# Patient Record
Sex: Female | Born: 1970 | Race: White | Hispanic: No | Marital: Married | Smoking: Former smoker
Health system: Southern US, Community
[De-identification: ages and names within clinical notes are randomized; demographics above are authoritative.]

## PROBLEM LIST (undated history)

## (undated) DIAGNOSIS — G4730 Sleep apnea, unspecified: Secondary | ICD-10-CM

## (undated) DIAGNOSIS — F319 Bipolar disorder, unspecified: Secondary | ICD-10-CM

## (undated) DIAGNOSIS — E7800 Pure hypercholesterolemia, unspecified: Secondary | ICD-10-CM

## (undated) DIAGNOSIS — E282 Polycystic ovarian syndrome: Secondary | ICD-10-CM

## (undated) DIAGNOSIS — F32A Depression, unspecified: Secondary | ICD-10-CM

## (undated) HISTORY — PX: WISDOM TOOTH EXTRACTION: SHX21

---

## 2011-12-30 DIAGNOSIS — E282 Polycystic ovarian syndrome: Secondary | ICD-10-CM | POA: Insufficient documentation

## 2011-12-30 DIAGNOSIS — E7800 Pure hypercholesterolemia, unspecified: Secondary | ICD-10-CM | POA: Insufficient documentation

## 2011-12-30 DIAGNOSIS — F329 Major depressive disorder, single episode, unspecified: Secondary | ICD-10-CM | POA: Insufficient documentation

## 2012-09-29 ENCOUNTER — Ambulatory Visit: Payer: Self-pay | Admitting: Internal Medicine

## 2013-02-12 ENCOUNTER — Ambulatory Visit: Payer: Self-pay | Admitting: Family Medicine

## 2014-01-27 DIAGNOSIS — Z9989 Dependence on other enabling machines and devices: Secondary | ICD-10-CM

## 2014-01-27 DIAGNOSIS — G4733 Obstructive sleep apnea (adult) (pediatric): Secondary | ICD-10-CM | POA: Insufficient documentation

## 2014-08-21 ENCOUNTER — Ambulatory Visit: Payer: Self-pay | Admitting: Family Medicine

## 2015-06-02 ENCOUNTER — Ambulatory Visit
Admission: RE | Admit: 2015-06-02 | Discharge: 2015-06-02 | Disposition: A | Discharge status: Home / Self Care | Payer: BLUE CROSS/BLUE SHIELD | Source: Ambulatory Visit | Attending: Family Medicine | Admitting: Family Medicine

## 2015-06-02 ENCOUNTER — Other Ambulatory Visit: Payer: Self-pay | Admitting: Family Medicine

## 2015-06-02 DIAGNOSIS — N946 Dysmenorrhea, unspecified: Secondary | ICD-10-CM

## 2015-06-02 DIAGNOSIS — E282 Polycystic ovarian syndrome: Secondary | ICD-10-CM | POA: Diagnosis not present

## 2015-06-04 ENCOUNTER — Emergency Department
Admission: EM | Admit: 2015-06-04 | Discharge: 2015-06-04 | Discharge status: Absent without leave | Payer: BLUE CROSS/BLUE SHIELD

## 2015-06-04 ENCOUNTER — Other Ambulatory Visit: Payer: Self-pay | Admitting: Family Medicine

## 2015-06-04 DIAGNOSIS — N946 Dysmenorrhea, unspecified: Secondary | ICD-10-CM

## 2015-06-10 ENCOUNTER — Ambulatory Visit: Payer: BLUE CROSS/BLUE SHIELD

## 2015-06-23 DIAGNOSIS — L509 Urticaria, unspecified: Secondary | ICD-10-CM | POA: Insufficient documentation

## 2016-07-08 DIAGNOSIS — D869 Sarcoidosis, unspecified: Secondary | ICD-10-CM

## 2016-07-08 HISTORY — DX: Sarcoidosis, unspecified: D86.9

## 2016-08-22 DIAGNOSIS — R9389 Abnormal findings on diagnostic imaging of other specified body structures: Secondary | ICD-10-CM | POA: Insufficient documentation

## 2016-09-08 DIAGNOSIS — D869 Sarcoidosis, unspecified: Secondary | ICD-10-CM | POA: Insufficient documentation

## 2017-03-08 DIAGNOSIS — R911 Solitary pulmonary nodule: Secondary | ICD-10-CM | POA: Insufficient documentation

## 2017-05-03 DIAGNOSIS — Z79899 Other long term (current) drug therapy: Secondary | ICD-10-CM | POA: Insufficient documentation

## 2017-09-11 ENCOUNTER — Other Ambulatory Visit: Payer: Self-pay | Admitting: Family Medicine

## 2017-09-11 DIAGNOSIS — Z1231 Encounter for screening mammogram for malignant neoplasm of breast: Secondary | ICD-10-CM

## 2017-09-20 ENCOUNTER — Encounter: Payer: Self-pay | Admitting: Radiology

## 2017-09-20 ENCOUNTER — Ambulatory Visit
Admission: RE | Admit: 2017-09-20 | Discharge: 2017-09-20 | Disposition: A | Discharge status: Home / Self Care | Payer: BLUE CROSS/BLUE SHIELD | Source: Ambulatory Visit | Attending: Family Medicine | Admitting: Family Medicine

## 2017-09-20 DIAGNOSIS — Z1231 Encounter for screening mammogram for malignant neoplasm of breast: Secondary | ICD-10-CM | POA: Diagnosis not present

## 2017-10-05 ENCOUNTER — Ambulatory Visit: Payer: BLUE CROSS/BLUE SHIELD

## 2018-04-21 ENCOUNTER — Ambulatory Visit
Admission: EM | Admit: 2018-04-21 | Discharge: 2018-04-21 | Disposition: A | Discharge status: Home / Self Care | Payer: BLUE CROSS/BLUE SHIELD | Attending: Family Medicine | Admitting: Family Medicine

## 2018-04-21 DIAGNOSIS — F3181 Bipolar II disorder: Secondary | ICD-10-CM | POA: Insufficient documentation

## 2018-04-21 DIAGNOSIS — J0100 Acute maxillary sinusitis, unspecified: Secondary | ICD-10-CM | POA: Diagnosis not present

## 2018-04-21 DIAGNOSIS — J309 Allergic rhinitis, unspecified: Secondary | ICD-10-CM | POA: Insufficient documentation

## 2018-04-21 MED ORDER — DOXYCYCLINE HYCLATE 100 MG PO CAPS: 100.0000 mg | ORAL_CAPSULE | Freq: Two times a day (BID) | ORAL | 0 refills | Status: DC

## 2018-04-21 NOTE — ED Provider Notes (Signed)
[facility]    CSN: [id number] Arrival date & time: 04/21/18  1321  History   Chief Complaint Chief Complaint  Patient presents with  . Facial Pain   HPI  47 year old female presents with concerns for sinusitis.  Patient states that she has not felt well for the past month.  She has had right maxillary sinus tenderness, congestion, and fullness.  She reports left ear pain as well.  Associated upper dental pain as well.  She has tried Xyzal, Careers adviser, and Sudafed without relief.  She is also been using Flonase and a Nettie pot without significant improvement.  She thinks that allergies are exacerbating this.  No reports of fever.  No reports of purulent nasal discharge.  No other associated symptoms.  No other complaints or concerns at this time.  Of note, she states that her  Symptoms have been worsening over the past 2 weeks, especially the last week.  PMH: Patient Active Problem List   Diagnosis Date Noted  . Bipolar 2 disorder (HCC) 04/21/2018  . Allergic rhinitis 04/21/2018  . Encounter for long-term (current) use of high-risk medication 05/03/2017  . Pulmonary nodule 03/08/2017  . Lofgren's syndrome 09/08/2016  . Abnormal CXR 08/22/2016  . Pressure urticaria 06/23/2015  . OSA on CPAP 01/27/2014  . Pure hypercholesterolemia 12/30/2011  . PCOS (polycystic ovarian syndrome) 12/30/2011  . Depression 12/30/2011   History reviewed. No pertinent surgical history.  OB History   None    Home Medications    Prior to Admission medications   Medication Sig Start Date End Date Taking? Authorizing Provider  aspirin EC 81 MG tablet Take by mouth.   Yes [provider]  buPROPion (WELLBUTRIN SR) 150 MG 12 hr tablet Take by mouth. 06/11/16  Yes [provider]  Cholecalciferol (VITAMIN D3) 2000 units capsule Take by mouth.   Yes [provider]  fluticasone (FLONASE) 50 MCG/ACT nasal spray SPRAY 2 SPRAYS INTO EACH NOSTRIL EVERY DAY 02/20/18  Yes  [provider]  gabapentin (NEURONTIN) 100 MG capsule TAKE 2 ORAL CAPSULES THREE TIMES DAILY AS NEEDED FOR SEVERE ANXIETY 04/11/18  Yes [provider]  lamoTRIgine (LAMICTAL) 150 MG tablet Take by mouth.   Yes [provider]  levocetirizine (XYZAL) 5 MG tablet Take by mouth. 09/19/16  Yes [provider]  metFORMIN (GLUCOPHAGE) 500 MG tablet Take 500 mg by mouth 2 (two) times daily with a meal. 03/10/18  Yes [provider]  Multiple Vitamin (MULTI-VITAMINS) TABS Take by mouth.   Yes [provider]  simvastatin (ZOCOR) 40 MG tablet TAKE 1 TABLET BY MOUTH EVERY DAY AT NIGHT 03/23/18  Yes [provider]  doxycycline (VIBRAMYCIN) 100 MG capsule Take 1 capsule (100 mg total) by mouth 2 (two) times daily. 04/21/18   Tommie Sams, DO   Family History Family History  Problem Relation Age of Onset  . Breast cancer Neg Hx    Social History Social History   Tobacco Use  . Smoking status: Never Smoker  . Smokeless tobacco: Never Used  Substance Use Topics  . Alcohol use: Not on file  . Drug use: Never   Allergies   Augmentin [amoxicillin-pot clavulanate]   Review of Systems Review of Systems  Constitutional: Negative for fever.  HENT: Positive for congestion, ear pain, sinus pressure and sinus pain.    Physical Exam Triage Vital Signs ED Triage Vitals  Enc Vitals Group     BP 04/21/18 1332 140/90     Pulse  Rate 04/21/18 1332 82     Resp 04/21/18 1332 18     Temp 04/21/18 1332 98.5 F (36.9 C)     Temp Source 04/21/18 1332 Oral     SpO2 04/21/18 1332 100 %     Weight 04/21/18 1334 265 lb (120.2 kg)     Height --      Head Circumference --      Peak Flow --      Pain Score 04/21/18 1334 3     Pain Loc --      Pain Edu? --      Excl. in GC? --    Updated Vital Signs BP 140/90 (BP Location: Left Arm)   Pulse 82   Temp 98.5 F (36.9 C) (Oral)   Resp 18   Wt 265 lb (120.2 kg)   LMP 04/17/2018 (Exact Date)    SpO2 100%    Physical Exam  Constitutional: She is oriented to person, place, and time. She appears well-developed. No distress.  HENT:  Head: Normocephalic and atraumatic.  Mouth/Throat: Oropharynx is clear and moist.  Normal TMs.  Cardiovascular: Normal rate and regular rhythm.  Pulmonary/Chest: Effort normal and breath sounds normal. She has no wheezes. She has no rales.  Neurological: She is alert and oriented to person, place, and time.  Psychiatric: She has a normal mood and affect. Her behavior is normal.  Nursing note and vitals reviewed.  UC Treatments / Results  Labs (all labs ordered are listed, but only abnormal results are displayed) Labs Reviewed - No data to display  EKG None  Radiology No results found.  Procedures Procedures (including critical care time)  Medications Ordered in UC Medications - No data to display  Initial Impression / Assessment and Plan / UC Course  I have reviewed the triage vital signs and the nursing notes.  Pertinent labs & imaging results that were available during my care of the patient were reviewed by me and considered in my medical decision making (see chart for details).    47 year old female presents with maxillary sinusitis.  Treating with Doxy.  Final Clinical Impressions(s) / UC Diagnoses   Final diagnoses:  Acute maxillary sinusitis, recurrence not specified   Discharge Instructions   None    ED Prescriptions    Medication Sig Dispense Auth. Provider   doxycycline (VIBRAMYCIN) 100 MG capsule Take 1 capsule (100 mg total) by mouth 2 (two) times daily. 14 capsule Tommie Samsook, Bell Cai G, DO     Controlled Substance Prescriptions [place] Controlled Substance Registry consulted? Not Applicable   Tommie SamsCook, Zaquan Duffner G, DO 04/21/18 1416

## 2018-04-21 NOTE — ED Triage Notes (Signed)
Pt here for possible sinus infection for a month. Has been having facial pressure, headache, teeth hurt and right ear feels full. Has been taking xyzal, allegra and sudafed.

## 2018-09-17 ENCOUNTER — Other Ambulatory Visit: Payer: Self-pay | Admitting: Family Medicine

## 2018-12-03 ENCOUNTER — Other Ambulatory Visit: Payer: Self-pay | Admitting: Family Medicine

## 2018-12-03 DIAGNOSIS — Z1231 Encounter for screening mammogram for malignant neoplasm of breast: Secondary | ICD-10-CM

## 2018-12-19 ENCOUNTER — Ambulatory Visit
Admission: RE | Admit: 2018-12-19 | Discharge: 2018-12-19 | Disposition: A | Discharge status: Home / Self Care | Payer: BLUE CROSS/BLUE SHIELD | Source: Ambulatory Visit | Attending: Family Medicine | Admitting: Family Medicine

## 2018-12-19 ENCOUNTER — Encounter (INDEPENDENT_AMBULATORY_CARE_PROVIDER_SITE_OTHER): Payer: Self-pay

## 2018-12-19 DIAGNOSIS — Z1231 Encounter for screening mammogram for malignant neoplasm of breast: Secondary | ICD-10-CM

## 2019-12-02 ENCOUNTER — Other Ambulatory Visit: Payer: Self-pay | Admitting: Family Medicine

## 2019-12-02 DIAGNOSIS — Z1231 Encounter for screening mammogram for malignant neoplasm of breast: Secondary | ICD-10-CM

## 2019-12-25 ENCOUNTER — Ambulatory Visit
Admission: RE | Admit: 2019-12-25 | Discharge: 2019-12-25 | Disposition: A | Discharge status: Home / Self Care | Payer: BC Managed Care – PPO | Source: Ambulatory Visit | Attending: Family Medicine | Admitting: Family Medicine

## 2019-12-25 ENCOUNTER — Other Ambulatory Visit: Payer: Self-pay

## 2019-12-25 DIAGNOSIS — Z1231 Encounter for screening mammogram for malignant neoplasm of breast: Secondary | ICD-10-CM | POA: Insufficient documentation

## 2021-02-01 ENCOUNTER — Other Ambulatory Visit: Payer: Self-pay | Admitting: Family Medicine

## 2021-02-01 DIAGNOSIS — Z1231 Encounter for screening mammogram for malignant neoplasm of breast: Secondary | ICD-10-CM

## 2021-02-10 ENCOUNTER — Other Ambulatory Visit: Payer: Self-pay

## 2021-02-10 ENCOUNTER — Ambulatory Visit
Admission: RE | Admit: 2021-02-10 | Discharge: 2021-02-10 | Disposition: A | Discharge status: Home / Self Care | Payer: BC Managed Care – PPO | Source: Ambulatory Visit | Attending: Family Medicine | Admitting: Family Medicine

## 2021-02-10 DIAGNOSIS — Z1231 Encounter for screening mammogram for malignant neoplasm of breast: Secondary | ICD-10-CM | POA: Insufficient documentation

## 2021-11-09 ENCOUNTER — Telehealth: Payer: Self-pay

## 2021-11-09 NOTE — Telephone Encounter (Signed)
CALLED PATIENT NO ANSWER LEFT VOICEMAIL FOR A CALL BACK ? ?

## 2021-11-10 ENCOUNTER — Other Ambulatory Visit: Payer: Self-pay

## 2021-11-10 ENCOUNTER — Telehealth: Payer: Self-pay

## 2021-11-10 DIAGNOSIS — Z1211 Encounter for screening for malignant neoplasm of colon: Secondary | ICD-10-CM

## 2021-11-10 MED ORDER — NA SULFATE-K SULFATE-MG SULF 17.5-3.13-1.6 GM/177ML PO SOLN: 1.0000 | Freq: Once | ORAL | 0 refills | Status: AC

## 2021-11-10 NOTE — Progress Notes (Signed)
Gastroenterology Pre-Procedure Review  Request Date: 12/03/2021 Requesting Physician: Dr. Allen Norris  PATIENT REVIEW QUESTIONS: The patient responded to the following health history questions as indicated:    1. Are you having any GI issues? no 2. Do you have a personal history of Polyps? no 3. Do you have a family history of Colon Cancer or Polyps? yes (polyps) 4. Diabetes Mellitus? no 5. Joint replacements in the past 12 months?no 6. Major health problems in the past 3 months?no 7. Any artificial heart valves, MVP, or defibrillator?no    MEDICATIONS & ALLERGIES:    Patient reports the following regarding taking any anticoagulation/antiplatelet therapy:   Plavix, Coumadin, Eliquis, Xarelto, Lovenox, Pradaxa, Brilinta, or Effient? no Aspirin? no  Patient confirms/reports the following medications:  Current Outpatient Medications  Medication Sig Dispense Refill   aspirin EC 81 MG tablet Take by mouth.     buPROPion (WELLBUTRIN SR) 150 MG 12 hr tablet Take by mouth.     Cholecalciferol (VITAMIN D3) 2000 units capsule Take by mouth.     doxycycline (VIBRAMYCIN) 100 MG capsule Take 1 capsule (100 mg total) by mouth 2 (two) times daily. 14 capsule 0   fluticasone (FLONASE) 50 MCG/ACT nasal spray SPRAY 2 SPRAYS INTO EACH NOSTRIL EVERY DAY  3   gabapentin (NEURONTIN) 100 MG capsule TAKE 2 ORAL CAPSULES THREE TIMES DAILY AS NEEDED FOR SEVERE ANXIETY  1   lamoTRIgine (LAMICTAL) 150 MG tablet Take by mouth.     levocetirizine (XYZAL) 5 MG tablet Take by mouth.     metFORMIN (GLUCOPHAGE) 500 MG tablet Take 500 mg by mouth 2 (two) times daily with a meal.  4   Multiple Vitamin (MULTI-VITAMINS) TABS Take by mouth.     simvastatin (ZOCOR) 40 MG tablet TAKE 1 TABLET BY MOUTH EVERY DAY AT NIGHT  4   No current facility-administered medications for this visit.    Patient confirms/reports the following allergies:  Allergies  Allergen Reactions   Augmentin [Amoxicillin-Pot Clavulanate] Diarrhea     No orders of the defined types were placed in this encounter.   AUTHORIZATION INFORMATION Primary Insurance: 1D#: Group #:  Secondary Insurance: 1D#: Group #:  SCHEDULE INFORMATION: Date: 12/03/2021 Time: Location:[facility]

## 2021-11-10 NOTE — Telephone Encounter (Signed)
CALLED PATIENT NO ANSWER LEFT VOICEMAIL FOR A CALL BACK ? ?

## 2021-11-11 ENCOUNTER — Encounter: Payer: Self-pay | Admitting: Gastroenterology

## 2021-12-02 MED ORDER — PROPOFOL 500 MG/50ML IV EMUL
INTRAVENOUS | Status: AC
  Filled 2021-12-02: qty 150

## 2021-12-02 MED ORDER — LIDOCAINE HCL (PF) 2 % IJ SOLN
INTRAMUSCULAR | Status: AC
  Filled 2021-12-02: qty 5

## 2021-12-02 MED ORDER — GLYCOPYRROLATE 0.2 MG/ML IJ SOLN
INTRAMUSCULAR | Status: AC
  Filled 2021-12-02: qty 1

## 2021-12-02 MED ORDER — PROPOFOL 500 MG/50ML IV EMUL
INTRAVENOUS | Status: AC
  Filled 2021-12-02: qty 50

## 2021-12-02 MED ORDER — DEXMEDETOMIDINE HCL IN NACL 200 MCG/50ML IV SOLN
INTRAVENOUS | Status: AC
  Filled 2021-12-02: qty 50

## 2021-12-02 MED ORDER — PROPOFOL 10 MG/ML IV BOLUS
INTRAVENOUS | Status: AC
  Filled 2021-12-02: qty 20

## 2021-12-03 ENCOUNTER — Other Ambulatory Visit: Payer: Self-pay

## 2021-12-03 ENCOUNTER — Encounter: Payer: Self-pay | Admitting: Gastroenterology

## 2021-12-03 ENCOUNTER — Ambulatory Visit: Payer: BC Managed Care – PPO | Admitting: Anesthesiology

## 2021-12-03 ENCOUNTER — Ambulatory Visit
Admission: RE | Admit: 2021-12-03 | Discharge: 2021-12-03 | Disposition: A | Discharge status: Home / Self Care | Payer: BC Managed Care – PPO | Attending: Gastroenterology | Admitting: Gastroenterology

## 2021-12-03 ENCOUNTER — Encounter
Admission: RE | Disposition: A | Discharge status: Home / Self Care | Payer: Self-pay | Source: Home / Self Care | Attending: Gastroenterology

## 2021-12-03 DIAGNOSIS — Z6841 Body Mass Index (BMI) 40.0 and over, adult: Secondary | ICD-10-CM | POA: Diagnosis not present

## 2021-12-03 DIAGNOSIS — Z87891 Personal history of nicotine dependence: Secondary | ICD-10-CM | POA: Diagnosis not present

## 2021-12-03 DIAGNOSIS — E7800 Pure hypercholesterolemia, unspecified: Secondary | ICD-10-CM | POA: Diagnosis not present

## 2021-12-03 DIAGNOSIS — Z79899 Other long term (current) drug therapy: Secondary | ICD-10-CM | POA: Diagnosis not present

## 2021-12-03 DIAGNOSIS — K5730 Diverticulosis of large intestine without perforation or abscess without bleeding: Secondary | ICD-10-CM | POA: Insufficient documentation

## 2021-12-03 DIAGNOSIS — Z1211 Encounter for screening for malignant neoplasm of colon: Secondary | ICD-10-CM

## 2021-12-03 DIAGNOSIS — K640 First degree hemorrhoids: Secondary | ICD-10-CM | POA: Diagnosis not present

## 2021-12-03 DIAGNOSIS — F319 Bipolar disorder, unspecified: Secondary | ICD-10-CM | POA: Insufficient documentation

## 2021-12-03 DIAGNOSIS — E785 Hyperlipidemia, unspecified: Secondary | ICD-10-CM | POA: Diagnosis not present

## 2021-12-03 DIAGNOSIS — G4730 Sleep apnea, unspecified: Secondary | ICD-10-CM | POA: Diagnosis not present

## 2021-12-03 HISTORY — DX: Sleep apnea, unspecified: G47.30

## 2021-12-03 HISTORY — DX: Bipolar disorder, unspecified: F31.9

## 2021-12-03 HISTORY — PX: COLONOSCOPY WITH PROPOFOL: SHX5780

## 2021-12-03 HISTORY — DX: Polycystic ovarian syndrome: E28.2

## 2021-12-03 HISTORY — DX: Depression, unspecified: F32.A

## 2021-12-03 HISTORY — DX: Pure hypercholesterolemia, unspecified: E78.00

## 2021-12-03 LAB — POCT PREGNANCY, URINE: Preg Test, Ur: NEGATIVE

## 2021-12-03 SURGERY — COLONOSCOPY WITH PROPOFOL
Anesthesia: General

## 2021-12-03 MED ORDER — ACETAMINOPHEN 325 MG PO TABS: 325.0000 mg | ORAL_TABLET | ORAL | Status: DC | PRN

## 2021-12-03 MED ORDER — PROPOFOL 10 MG/ML IV BOLUS
INTRAVENOUS | Status: DC | PRN
  Administered 2021-12-03 (×3): 40 mg via INTRAVENOUS
  Administered 2021-12-03: 150 mg via INTRAVENOUS
  Administered 2021-12-03: 40 mg via INTRAVENOUS

## 2021-12-03 MED ORDER — ACETAMINOPHEN 160 MG/5ML PO SOLN: 325.0000 mg | ORAL | Status: DC | PRN

## 2021-12-03 MED ORDER — ONDANSETRON HCL 4 MG/2ML IJ SOLN: 4.0000 mg | Freq: Once | INTRAMUSCULAR | Status: DC | PRN

## 2021-12-03 MED ORDER — LACTATED RINGERS IV SOLN: INTRAVENOUS | Status: DC

## 2021-12-03 MED ORDER — LIDOCAINE HCL (CARDIAC) PF 100 MG/5ML IV SOSY
PREFILLED_SYRINGE | INTRAVENOUS | Status: DC | PRN
  Administered 2021-12-03: 30 mg via INTRAVENOUS

## 2021-12-03 MED ORDER — SODIUM CHLORIDE 0.9 % IV SOLN: INTRAVENOUS | Status: DC

## 2021-12-03 SURGICAL SUPPLY — 22 items

## 2021-12-03 NOTE — Op Note (Signed)
[facility] Gastroenterology Patient Name: Christine Smith Procedure Date: 12/03/2021 10:09 AM MRN: [id number] Account #: [id number] Date of Birth: 13-Sep-1971 Admit Type: Outpatient Age: 51 Room: [facility] OR ROOM 01 Gender: Female Note Status: Finalized Instrument Name: [id number] Procedure:             Colonoscopy Indications:           Screening for colorectal malignant neoplasm Providers:             Midge Minium MD, MD Referring MD:          Letitia Caul MD, MD (Referring MD) Medicines:             Propofol per Anesthesia Complications:         No immediate complications. Procedure:             Pre-Anesthesia Assessment:                        - Prior to the procedure, a History and Physical was                         performed, and patient medications and allergies were                         reviewed. The patient's tolerance of previous                         anesthesia was also reviewed. The risks and benefits                         of the procedure and the sedation options and risks                         were discussed with the patient. All questions were                         answered, and informed consent was obtained. Prior                         Anticoagulants: The patient has taken no previous                         anticoagulant or antiplatelet agents. ASA Grade                         Assessment: II - A patient with mild systemic disease.                         After reviewing the risks and benefits, the patient                         was deemed in satisfactory condition to undergo the                         procedure.                        After obtaining informed consent, the colonoscope was  passed under direct vision. Throughout the procedure,                         the patient's blood pressure, pulse, and oxygen                         saturations were monitored continuously. The                          Colonoscope was introduced through the anus and                         advanced to the the cecum, identified by appendiceal                         orifice and ileocecal valve. The colonoscopy was                         performed without difficulty. The patient tolerated                         the procedure well. The quality of the bowel                         preparation was excellent. Findings:      The perianal and digital rectal examinations were normal.      Multiple small-mouthed diverticula were found in the sigmoid colon.      Non-bleeding internal hemorrhoids were found during retroflexion. The       hemorrhoids were Grade I (internal hemorrhoids that do not prolapse). Impression:            - Diverticulosis in the sigmoid colon.                        - Non-bleeding internal hemorrhoids.                        - No specimens collected. Recommendation:        - Discharge patient to home.                        - Resume previous diet.                        - Continue present medications.                        - Repeat colonoscopy in 10 years for screening                         purposes. Procedure Code(s):     --- Professional ---                        [phone], Colonoscopy, flexible; diagnostic, including                         collection of specimen(s) by brushing or washing, when                         performed (separate procedure) Diagnosis  Code(s):     --- Professional ---                        Z12.11, Encounter for screening for malignant neoplasm                         of colon CPT copyright 2019 [employer]. All rights reserved. The codes documented in this report are preliminary and upon coder review may  be revised to meet current compliance requirements. Midge Minium MD, MD 12/03/2021 10:29:54 AM This report has been signed electronically. Number of Addenda: 0 Note Initiated On: 12/03/2021 10:09 AM Scope Withdrawal Time: 0 hours 7 minutes  17 seconds  Total Procedure Duration: 0 hours 10 minutes 41 seconds  Estimated Blood Loss:  Estimated blood loss: none.      [facility]

## 2021-12-03 NOTE — Transfer of Care (Signed)
Immediate Anesthesia Transfer of Care Note  Patient: [name]  Procedure(s) Performed: COLONOSCOPY WITH PROPOFOL  Patient Location: PACU  Anesthesia Type: General  Level of Consciousness: awake, alert  and patient cooperative  Airway and Oxygen Therapy: Patient Spontanous Breathing and Patient connected to supplemental oxygen  Post-op Assessment: Post-op Vital signs reviewed, Patient's Cardiovascular Status Stable, Respiratory Function Stable, Patent Airway and No signs of Nausea or vomiting  Post-op Vital Signs: Reviewed and stable  Complications: No notable events documented.

## 2021-12-03 NOTE — H&P (Signed)
Christine Minium, MD [facility] [address], [address] [place], [place] [address] Phone: [phone] Fax : [phone]  Primary Care Physician:  Christine Gala, MD Primary Gastroenterologist:  Dr. Servando Smith  Pre-Procedure History & Physical: HPI:  Christine Smith is a 51 y.o. female is here for a screening colonoscopy.   Past Medical History:  Diagnosis Date   Bipolar disorder (HCC)    type 2   Depression    Hypercholesteremia    Lofgren's syndrome 07/2016   no reoccuring sx in the last few yrs as of 11/11/21   PCOS (polycystic ovarian syndrome)    Sleep apnea    uses CPAP    Past Surgical History:  Procedure Laterality Date   WISDOM TOOTH EXTRACTION      Prior to Admission medications   Medication Sig Start Date End Date Taking? Authorizing Provider  Ascorbic Acid (VITAMIN C ER PO) Take by mouth.   Yes [provider]  Cholecalciferol (VITAMIN D3) 2000 units capsule Take by mouth.   Yes [provider]  Coenzyme Q10 (CO Q 10) 10 MG CAPS Take by mouth.   Yes [provider]  fluticasone (FLONASE) 50 MCG/ACT nasal spray SPRAY 2 SPRAYS INTO EACH NOSTRIL EVERY DAY 02/20/18  Yes [provider]  gabapentin (NEURONTIN) 100 MG capsule TAKE 2 ORAL CAPSULES THREE TIMES DAILY AS NEEDED FOR SEVERE ANXIETY 04/11/18  Yes [provider]  lamoTRIgine (LAMICTAL) 150 MG tablet Take 100 mg by mouth daily.   Yes [provider]  levocetirizine (XYZAL) 5 MG tablet Take by mouth. 09/19/16  Yes [provider]  metFORMIN (GLUCOPHAGE) 500 MG tablet Take 500 mg by mouth 2 (two) times daily with a meal. 03/10/18  Yes [provider]  Multiple Vitamin (MULTI-VITAMINS) TABS Take by mouth.   Yes [provider]  Multiple Vitamins-Minerals (ZINC PO) Take by mouth.   Yes [provider]  simvastatin (ZOCOR) 40 MG tablet TAKE 1 TABLET BY MOUTH EVERY DAY AT NIGHT 03/23/18  Yes [provider]  traZODone (DESYREL) 50 MG  tablet Take 50 mg by mouth at bedtime.   Yes [provider]  vortioxetine HBr (TRINTELLIX) 10 MG TABS tablet Take 10 mg by mouth daily.   Yes [provider]  aspirin EC 81 MG tablet Take by mouth. Patient not taking: Reported on 11/11/2021    [provider]  buPROPion (WELLBUTRIN SR) 150 MG 12 hr tablet Take by mouth. Patient not taking: Reported on 11/11/2021 06/11/16   [provider]  doxycycline (VIBRAMYCIN) 100 MG capsule Take 1 capsule (100 mg total) by mouth 2 (two) times daily. 04/21/18   Tommie Sams, DO    Allergies as of 11/10/2021 - Review Complete 11/10/2021  Allergen Reaction Noted   Augmentin [amoxicillin-pot clavulanate] Diarrhea 04/21/2018    Family History  Problem Relation Age of Onset   Breast cancer Neg Hx     Social History   Socioeconomic History   Marital status: Married    Spouse name: Not on file   Number of children: Not on file   Years of education: Not on file   Highest education level: Not on file  Occupational History   Not on file  Tobacco Use   Smoking status: Former    Years: 12.00    Types: Cigarettes    Quit date: 2017    Years since quitting: 6.0   Smokeless tobacco: Never  Vaping Use   Vaping Use: Never used  Substance and  Sexual Activity   Alcohol use: Yes    Alcohol/week: 23.0 standard drinks    Types: 1 Glasses of wine, 21 Cans of beer, 1 Shots of liquor per week   Drug use: Never   Sexual activity: Not on file  Other Topics Concern   Not on file  Social History Narrative   Not on file   Social Determinants of Health   Financial Resource Strain: Not on file  Food Insecurity: Not on file  Transportation Needs: Not on file  Physical Activity: Not on file  Stress: Not on file  Social Connections: Not on file  Intimate Partner Violence: Not on file    Review of Systems: See HPI, otherwise negative ROS  Physical Exam: BP (!) 154/88    Pulse (!) 101    Temp 98.1 F (36.7 C) (Temporal)     Ht 5\' 4"  (1.626 m)    Wt 118.8 kg    SpO2 98%    BMI 44.97 kg/m  General:   Alert,  pleasant and cooperative in NAD Head:  Normocephalic and atraumatic. Neck:  Supple; no masses or thyromegaly. Lungs:  Clear throughout to auscultation.    Heart:  Regular rate and rhythm. Abdomen:  Soft, nontender and nondistended. Normal bowel sounds, without guarding, and without rebound.   Neurologic:  Alert and  oriented x4;  grossly normal neurologically.  Impression/Plan: Christine Smith is now here to undergo a screening colonoscopy.  Risks, benefits, and alternatives regarding colonoscopy have been reviewed with the patient.  Questions have been answered.  All parties agreeable.

## 2021-12-03 NOTE — Anesthesia Postprocedure Evaluation (Signed)
Anesthesia Post Note  Patient: Christine Smith  Procedure(s) Performed: COLONOSCOPY WITH PROPOFOL     Patient location during evaluation: PACU Anesthesia Type: General Level of consciousness: awake Pain management: pain level controlled Vital Signs Assessment: post-procedure vital signs reviewed and stable Respiratory status: respiratory function stable Cardiovascular status: stable Postop Assessment: no signs of nausea or vomiting Anesthetic complications: no   No notable events documented.  Jola Babinski

## 2021-12-03 NOTE — Anesthesia Preprocedure Evaluation (Signed)
Anesthesia Evaluation  Patient identified by MRN, date of birth, ID band Patient awake    Reviewed: Allergy & Precautions, NPO status   Airway Mallampati: II  TM Distance: >3 FB     Dental   Pulmonary sleep apnea , former smoker,    Pulmonary exam normal        Cardiovascular  Rhythm:Regular Rate:Normal  HLD   Neuro/Psych PSYCHIATRIC DISORDERS Depression Bipolar Disorder    GI/Hepatic   Endo/Other  Morbid obesity (BMI 45)  Renal/GU      Musculoskeletal   Abdominal   Peds  Hematology   Anesthesia Other Findings   Reproductive/Obstetrics                             Anesthesia Physical Anesthesia Plan  ASA: 3  Anesthesia Plan: General   Post-op Pain Management:    Induction: Intravenous  PONV Risk Score and Plan: Propofol infusion, TIVA and Treatment may vary due to age or medical condition  Airway Management Planned: Natural Airway and Nasal Cannula  Additional Equipment:   Intra-op Plan:   Post-operative Plan:   Informed Consent: I have reviewed the patients History and Physical, chart, labs and discussed the procedure including the risks, benefits and alternatives for the proposed anesthesia with the patient or authorized representative who has indicated his/her understanding and acceptance.       Plan Discussed with: CRNA  Anesthesia Plan Comments:         Anesthesia Quick Evaluation

## 2021-12-03 NOTE — Anesthesia Procedure Notes (Signed)
Date/Time: 12/03/2021 10:14 AM Performed by: Maree Krabbe, CRNA Pre-anesthesia Checklist: Patient identified, Emergency Drugs available, Suction available, Timeout performed and Patient being monitored Patient Re-evaluated:Patient Re-evaluated prior to induction Oxygen Delivery Method: Nasal cannula Placement Confirmation: positive ETCO2

## 2021-12-06 ENCOUNTER — Encounter: Payer: Self-pay | Admitting: Gastroenterology

## 2022-01-23 ENCOUNTER — Other Ambulatory Visit: Payer: Self-pay

## 2022-01-23 ENCOUNTER — Encounter: Payer: Self-pay | Admitting: Emergency Medicine

## 2022-01-23 ENCOUNTER — Ambulatory Visit
Admission: EM | Admit: 2022-01-23 | Discharge: 2022-01-23 | Disposition: A | Discharge status: Home / Self Care | Payer: BC Managed Care – PPO | Attending: Emergency Medicine | Admitting: Emergency Medicine

## 2022-01-23 DIAGNOSIS — H66002 Acute suppurative otitis media without spontaneous rupture of ear drum, left ear: Secondary | ICD-10-CM | POA: Diagnosis not present

## 2022-01-23 DIAGNOSIS — J069 Acute upper respiratory infection, unspecified: Secondary | ICD-10-CM

## 2022-01-23 DIAGNOSIS — R051 Acute cough: Secondary | ICD-10-CM

## 2022-01-23 MED ORDER — IPRATROPIUM BROMIDE 0.06 % NA SOLN: 2.0000 | Freq: Four times a day (QID) | NASAL | 12 refills | Status: DC

## 2022-01-23 MED ORDER — CEFDINIR 300 MG PO CAPS: 300.0000 mg | ORAL_CAPSULE | Freq: Two times a day (BID) | ORAL | 0 refills | Status: AC

## 2022-01-23 MED ORDER — PROMETHAZINE-DM 6.25-15 MG/5ML PO SYRP: 5.0000 mL | ORAL_SOLUTION | Freq: Four times a day (QID) | ORAL | 0 refills | Status: DC | PRN

## 2022-01-23 NOTE — ED Triage Notes (Signed)
Patient c/o cough and chest congestion since Monday.  Patient states that she had a visit with a doctor on Thursday and was given [employer].  Patient also has been taking OTC Delsym and Ibuprofen.  Patient reports fever off and on.  ?

## 2022-01-23 NOTE — ED Provider Notes (Signed)
?Christine Smith ? ? ? ?CSN: [id number] ?Arrival date & time: 01/23/22  1411 ? ? ?  ? ?History   ?Chief Complaint ?Chief Complaint  ?Patient presents with  ? Cough  ? ? ?HPI ?[name] is a 51 y.o. female.  ? ?HPI ? ?51 year old female here for evaluation of respiratory complaints. ? ?Patient reports that 9 days ago she developed symptoms of a sore throat followed by a cough 2 days later.  The cough is also accompanied by fever with a Tmax of 100.7, runny nose and nasal congestion, ear pain, significant sinus pressure, nonproductive cough, and shortness of breath when she gets into a coughing fit.  She denies any wheezing or GI complaints.  She has had 3 negative COVID tests at home.  She did have a telehealth visit with a doctor 3 days ago that resulted in a prescription for [employer] which have not been helping.  She is also been taking over-the-counter Delsym without any significant improvement of symptoms. ? ?Past Medical History:  ?Diagnosis Date  ? Bipolar disorder ([place])   ? type 2  ? Depression   ? Hypercholesteremia   ? Lofgren's syndrome 07/2016  ? no reoccuring sx in the last few yrs as of 11/11/21  ? PCOS (polycystic ovarian syndrome)   ? Sleep apnea   ? uses CPAP  ? ? ?Patient Active Problem List  ? Diagnosis Date Noted  ? Colon cancer screening   ? Bipolar 2 disorder ([place]) 04/21/2018  ? Allergic rhinitis 04/21/2018  ? Encounter for long-term (current) use of high-risk medication 05/03/2017  ? Pulmonary nodule 03/08/2017  ? Lofgren's syndrome 09/08/2016  ? Abnormal CXR 08/22/2016  ? Pressure urticaria 06/23/2015  ? OSA on CPAP 01/27/2014  ? Pure hypercholesterolemia 12/30/2011  ? PCOS (polycystic ovarian syndrome) 12/30/2011  ? Depression 12/30/2011  ? ? ?Past Surgical History:  ?Procedure Laterality Date  ? COLONOSCOPY WITH PROPOFOL N/A 12/03/2021  ? Procedure: COLONOSCOPY WITH PROPOFOL;  Surgeon: Lucilla Lame, MD;  Location: [facility];  Service: Endoscopy;  Laterality:  N/A;  ? WISDOM TOOTH EXTRACTION    ? ? ?OB History   ?No obstetric history on file. ?  ? ? ? ?Home Medications   ? ?Prior to Admission medications   ?Medication Sig Start Date End Date Taking? Authorizing Provider  ?Ascorbic Acid (VITAMIN C ER PO) Take by mouth.   Yes [provider]  ?cefdinir (OMNICEF) 300 MG capsule Take 1 capsule (300 mg total) by mouth 2 (two) times daily for 10 days. 01/23/22 02/02/22 Yes Margarette Canada, NP  ?Cholecalciferol (VITAMIN D3) 2000 units capsule Take by mouth.   Yes [provider]  ?fluticasone (FLONASE) 50 MCG/ACT nasal spray SPRAY 2 SPRAYS INTO EACH NOSTRIL EVERY DAY 02/20/18  Yes [provider]  ?ipratropium (ATROVENT) 0.06 % nasal spray Place 2 sprays into both nostrils 4 (four) times daily. 01/23/22  Yes Margarette Canada, NP  ?lamoTRIgine (LAMICTAL) 150 MG tablet Take 100 mg by mouth daily.   Yes [provider]  ?levocetirizine (XYZAL) 5 MG tablet Take by mouth. 09/19/16  Yes [provider]  ?metFORMIN (GLUCOPHAGE) 500 MG tablet Take 500 mg by mouth 2 (two) times daily with a meal. 03/10/18  Yes [provider]  ?Multiple Vitamins-Minerals (ZINC PO) Take by mouth.   Yes [provider]  ?promethazine-dextromethorphan (PROMETHAZINE-DM) 6.25-15 MG/5ML syrup Take 5 mLs by mouth 4 (four) times daily as needed. 01/23/22  Yes Margarette Canada, NP  ?simvastatin (ZOCOR) 40  MG tablet TAKE 1 TABLET BY MOUTH EVERY DAY AT NIGHT 03/23/18  Yes [provider]  ?vortioxetine HBr (TRINTELLIX) 10 MG TABS tablet Take 10 mg by mouth daily.   Yes [provider]  ?buPROPion (WELLBUTRIN SR) 150 MG 12 hr tablet Take by mouth. ?Patient not taking: Reported on 11/11/2021 06/11/16   [provider]  ?Coenzyme Q10 (CO Q 10) 10 MG CAPS Take by mouth.    [provider]  ?traZODone (DESYREL) 50 MG tablet Take 50 mg by mouth at bedtime.    [provider]  ? ? ?Family History ?Family History  ?Problem Relation Age of  Onset  ? Breast cancer Neg Hx   ? ? ?Social History ?Social History  ? ?Tobacco Use  ? Smoking status: Former  ?  Years: 12.00  ?  Types: Cigarettes  ?  Quit date: 2017  ?  Years since quitting: 6.2  ? Smokeless tobacco: Never  ?Vaping Use  ? Vaping Use: Never used  ?Substance Use Topics  ? Alcohol use: Yes  ?  Alcohol/week: 23.0 standard drinks  ?  Types: 1 Glasses of wine, 21 Cans of beer, 1 Shots of liquor per week  ? Drug use: Never  ? ? ? ?Allergies   ?Augmentin [amoxicillin-pot clavulanate] ? ? ?Review of Systems ?Review of Systems  ?Constitutional:  Positive for fever. Negative for activity change and appetite change.  ?HENT:  Positive for congestion, ear pain, rhinorrhea and sore throat.   ?Respiratory:  Positive for cough and shortness of breath. Negative for wheezing.   ?Gastrointestinal:  Negative for diarrhea, nausea and vomiting.  ?Skin:  Negative for rash.  ?Hematological: Negative.   ?Psychiatric/Behavioral: Negative.    ? ? ?Physical Exam ?Triage Vital Signs ?ED Triage Vitals  ?Enc Vitals Group  ?   BP 01/23/22 1422 (!) 150/79  ?   Pulse Rate 01/23/22 1422 94  ?   Resp 01/23/22 1422 14  ?   Temp 01/23/22 1422 98.5 ?F (36.9 ?C)  ?   Temp Source 01/23/22 1422 Oral  ?   SpO2 01/23/22 1422 98 %  ?   Weight 01/23/22 1417 270 lb (122.5 kg)  ?   Height 01/23/22 1417 5\' 4"  (1.626 m)  ?   Head Circumference --   ?   Peak Flow --   ?   Pain Score 01/23/22 1417 0  ?   Pain Loc --   ?   Pain Edu? --   ?   Excl. in [place]? --   ? ?No data found. ? ?Updated Vital Signs ?BP (!) 150/79 (BP Location: Left Arm)   Pulse 94   Temp 98.5 ?F (36.9 ?C) (Oral)   Resp 14   Ht 5\' 4"  (1.626 m)   Wt 270 lb (122.5 kg)   LMP 01/09/2022 (Approximate)   SpO2 98%   BMI 46.35 kg/m?  ? ?Visual Acuity ?Right Eye Distance:   ?Left Eye Distance:   ?Bilateral Distance:   ? ?Right Eye Near:   ?Left Eye Near:    ?Bilateral Near:    ? ?Physical Exam ?Vitals and nursing note reviewed.  ?Constitutional:   ?   Appearance: Normal appearance.  She is not ill-appearing.  ?HENT:  ?   Head: Normocephalic and atraumatic.  ?   Right Ear: Tympanic membrane, ear canal and external ear normal. There is no impacted cerumen.  ?   Left Ear: Ear canal and external ear normal. There is no impacted cerumen.  ?  Nose: Congestion and rhinorrhea present.  ?   Mouth/Throat:  ?   Mouth: Mucous membranes are moist.  ?   Pharynx: Oropharynx is clear. No oropharyngeal exudate or posterior oropharyngeal erythema.  ?Cardiovascular:  ?   Rate and Rhythm: Normal rate and regular rhythm.  ?   Pulses: Normal pulses.  ?   Heart sounds: Normal heart sounds. No murmur heard. ?  No friction rub. No gallop.  ?Pulmonary:  ?   Effort: Pulmonary effort is normal.  ?   Breath sounds: Normal breath sounds. No wheezing, rhonchi or rales.  ?Musculoskeletal:  ?   Cervical back: Normal range of motion and neck supple.  ?Lymphadenopathy:  ?   Cervical: No cervical adenopathy.  ?Skin: ?   General: Skin is warm and dry.  ?   Capillary Refill: Capillary refill takes less than 2 seconds.  ?   Findings: No erythema or rash.  ?Neurological:  ?   General: No focal deficit present.  ?   Mental Status: She is alert and oriented to person, place, and time.  ?Psychiatric:     ?   Mood and Affect: Mood normal.     ?   Behavior: Behavior normal.     ?   Thought Content: Thought content normal.     ?   Judgment: Judgment normal.  ? ? ? ?UC Treatments / Results  ?Labs ?(all labs ordered are listed, but only abnormal results are displayed) ?Labs Reviewed - No data to display ? ?EKG ? ? ?Radiology ?No results found. ? ?Procedures ?Procedures (including critical care time) ? ?Medications Ordered in UC ?Medications - No data to display ? ?Initial Impression / Assessment and Plan / UC Course  ?I have reviewed the triage vital signs and the nursing notes. ? ?Pertinent labs & imaging results that were available during my care of the patient were reviewed by me and considered in my medical decision making (see chart  for details). ? ?Patient is a very pleasant, nontoxic-appearing 51 year old female here for evaluation of respiratory complaints as outlined in the HPI above.  She has been using over-the-counter and prescription

## 2022-01-23 NOTE — Discharge Instructions (Signed)
Take the Cefdinir twice daily for 10 days with food for treatment of your ear infection.  Take an over-the-counter probiotic 1 hour after each dose of antibiotic to prevent diarrhea.  Use over-the-counter Tylenol and ibuprofen as needed for pain or fever.  Place a hot water bottle, or heating pad, underneath your pillowcase at night to help dilate up your ear and aid in pain relief as well as resolution of the infection.  Use the Atrovent nasal spray, 2 squirts in each nostril every 6 hours, as needed for runny nose and postnasal drip.  Use the Tessalon Perles every 8 hours during the day.  Take them with a small sip of water.  They may give you some numbness to the base of your tongue or a metallic taste in your mouth, this is normal.  Use the Promethazine DM cough syrup at bedtime for cough and congestion.  It will make you drowsy so do not take it during the day.  Return for reevaluation for any new or worsening symptoms.  

## 2022-01-27 ENCOUNTER — Other Ambulatory Visit: Payer: Self-pay | Admitting: Family Medicine

## 2022-01-27 DIAGNOSIS — Z1231 Encounter for screening mammogram for malignant neoplasm of breast: Secondary | ICD-10-CM

## 2022-02-07 NOTE — Progress Notes (Signed)
[facility] ?[address] ?[place], [place] [address] ? ?Pulmonary Sleep Medicine  ? ?Office Visit Note ? ?Patient Name: [facility] ?DOB: 14-Jan-1971 ?MRN [id number] ? ? ? ?Chief Complaint: Obstructive Sleep Apnea visit ? ?Brief History: ? ?Christine Smith is seen today for an initial consult for CPAP@ 6 cmH2O. The patient has a 15 year history of sleep apnea. Patient is using PAP nightly. She has used her new device for 8 nights and then went back to her old device because the pressure felt better on it with a higher pressure. Recent studies recommended a pressure setting of 6cmh20 but patient had been on 10cmh20 previously. Patient rests better with no reported snoring or gasps on the 10cmh20 from her spouse. The patient feels rested after sleeping with PAP.  The patient reports benefit from PAP use. Reported sleepiness is  improved and the Epworth Sleepiness Score is 3 out of 24. The patient does not take naps. The patient complains of the following: current pressure is not adequate, patient doesn't feel like she is getting enough air.  The compliance download shows 18% compliance with an average use time of 7 hours 56 minutes. The AHI is 0.6.  Due to too low pressure, she used her old machine recently which is why her compliance is reduced. The patient does have some restlessness of limb movements disrupting sleep, but attributes this to medication she started and is switching the timing. ? ?ROS ? ?General: (-) fever, (-) chills, (-) night sweat ?Nose and Sinuses: (-) nasal stuffiness or itchiness, (-) postnasal drip, (-) nosebleeds, (-) sinus trouble. ?Mouth and Throat: (-) sore throat, (-) hoarseness. ?Neck: (-) swollen glands, (-) enlarged thyroid, (-) neck pain. ?Respiratory: - cough, - shortness of breath, - wheezing. ?Neurologic: + numbness, + tingling. ?Psychiatric: - anxiety, - depression ? ? ?Current Medication: ?Outpatient Encounter Medications as of 02/08/2022  ?Medication Sig Note  ?  Ascorbic Acid (VITAMIN C ER PO) Take by mouth.   ? Cholecalciferol (VITAMIN D3) 2000 units capsule Take by mouth.   ? Coenzyme Q10 (CO Q 10) 10 MG CAPS Take by mouth.   ? fluticasone (FLONASE) 50 MCG/ACT nasal spray SPRAY 2 SPRAYS INTO EACH NOSTRIL EVERY DAY   ? ipratropium (ATROVENT) 0.06 % nasal spray Place 2 sprays into both nostrils 4 (four) times daily.   ? lamoTRIgine (LAMICTAL) 150 MG tablet Take 100 mg by mouth daily.   ? levocetirizine (XYZAL) 5 MG tablet Take by mouth.   ? metFORMIN (GLUCOPHAGE) 500 MG tablet Take 500 mg by mouth 2 (two) times daily with a meal. 11/11/2021: Takes for PCOS, not diabetes  ? Multiple Vitamins-Minerals (ZINC PO) Take by mouth.   ? promethazine-dextromethorphan (PROMETHAZINE-DM) 6.25-15 MG/5ML syrup Take 5 mLs by mouth 4 (four) times daily as needed.   ? simvastatin (ZOCOR) 40 MG tablet TAKE 1 TABLET BY MOUTH EVERY DAY AT NIGHT   ? vortioxetine HBr (TRINTELLIX) 10 MG TABS tablet Take 10 mg by mouth daily.   ? [DISCONTINUED] buPROPion (WELLBUTRIN SR) 150 MG 12 hr tablet Take by mouth. (Patient not taking: Reported on 11/11/2021)   ? [DISCONTINUED] traZODone (DESYREL) 50 MG tablet Take 50 mg by mouth at bedtime.   ? ?No facility-administered encounter medications on file as of 02/08/2022.  ? ? ?Surgical History: ?Past Surgical History:  ?Procedure Laterality Date  ? COLONOSCOPY WITH PROPOFOL N/A 12/03/2021  ? Procedure: COLONOSCOPY WITH PROPOFOL;  Surgeon: Midge Minium, MD;  Location: [facility];  Service: Endoscopy;  Laterality:  N/A;  ? WISDOM TOOTH EXTRACTION    ? ? ?Medical History: ?Past Medical History:  ?Diagnosis Date  ? Bipolar disorder ([place])   ? type 2  ? Depression   ? Hypercholesteremia   ? Lofgren's syndrome 07/2016  ? no reoccuring sx in the last few yrs as of 11/11/21  ? PCOS (polycystic ovarian syndrome)   ? Sleep apnea   ? uses CPAP  ? ? ?Family History: ?Non contributory to the present illness ? ?Social History: ?Social History  ? ?Socioeconomic History  ?  Marital status: Married  ?  Spouse name: Not on file  ? Number of children: Not on file  ? Years of education: Not on file  ? Highest education level: Not on file  ?Occupational History  ? Not on file  ?Tobacco Use  ? Smoking status: Former  ?  Years: 12.00  ?  Types: Cigarettes  ?  Quit date: 2017  ?  Years since quitting: 6.2  ? Smokeless tobacco: Never  ?Vaping Use  ? Vaping Use: Never used  ?Substance and Sexual Activity  ? Alcohol use: Yes  ?  Alcohol/week: 23.0 standard drinks  ?  Types: 1 Glasses of wine, 21 Cans of beer, 1 Shots of liquor per week  ? Drug use: Never  ? Sexual activity: Not on file  ?Other Topics Concern  ? Not on file  ?Social History Narrative  ? Not on file  ? ?Social Determinants of Health  ? ?Financial Resource Strain: Not on file  ?Food Insecurity: Not on file  ?Transportation Needs: Not on file  ?Physical Activity: Not on file  ?Stress: Not on file  ?Social Connections: Not on file  ?Intimate Partner Violence: Not on file  ? ? ?Vital Signs: ?Blood pressure (!) 137/99, pulse 90, resp. rate 16, height 5\' 4"  (1.626 m), weight 275 lb 9.6 oz (125 kg), last menstrual period 01/09/2022, SpO2 97 %. ?Body mass index is 47.31 kg/m?.  ? ? ?Examination: ?General Appearance: The patient is well-developed, well-nourished, and in no distress. ?Neck Circumference: 41.5cm ?Skin: Gross inspection of skin unremarkable. ?Head: normocephalic, no gross deformities. ?Eyes: no gross deformities noted. ?ENT: ears appear grossly normal ?Neurologic: Alert and oriented. No involuntary movements. ? ? ? ?EPWORTH SLEEPINESS SCALE: ? ?Scale:  ?(0)= no chance of dozing; (1)= slight chance of dozing; (2)= moderate chance of dozing; (3)= high chance of dozing ? ?Chance  Situtation ?   ?Sitting and reading: 0 ?  ? Watching TV: 0 ?   ?Sitting Inactive in public: 0 ?   ?As a passenger in car: 2   ?   ?Lying down to rest: 1 ?   ?Sitting and talking: 0 ?   ?Sitting quielty after lunch: 0 ?   ?In a car, stopped in traffic:  0 ? ? ?TOTAL SCORE:   3 out of 24 ? ? ? ?SLEEP STUDIES: ? ?PSG (11/2021) AHI 7.5/hr, REM AHI 19.4/hr, min SpO2 85% ?Titration (11/2021) CPAP@ 6 cmH2O, PLM 10.1 ? ? ?CPAP COMPLIANCE DATA: ? ?Date Range: 12/30/2021-02/05/2022  (PATIENT USED HER OLD MACHINE THE REMAINDER OF THE MONTH DUE TO PRESSURE SETTING) ? ?Average Daily Use: 7 hours 58 minutes ? ?Median Use: 7 hours 46 minutes ? ?Compliance for > 4 Hours: 18% ? ?AHI: 0.6 respiratory events per hour ? ?Days Used: 8/38 days ? ?Mask Leak: 3.0 ? ?95th Percentile Pressure: 6 ? ? ? ? ? ? ? ? ?LABS: ?Recent Results (from the past 2160 hour(s))  ?Pregnancy, urine  POC     Status: None  ? Collection Time: 12/03/21  9:15 AM  ?Result Value Ref Range  ? Preg Test, Ur NEGATIVE NEGATIVE  ?  Comment:        ?THE SENSITIVITY OF THIS ?METHODOLOGY IS >24 mIU/mL ?  ? ? ?Radiology: ?No results found. ? ?No results found. ? ?No results found. ? ? ? ?Assessment and Plan: ?Patient Active Problem List  ? Diagnosis Date Noted  ? Colon cancer screening   ? Bipolar 2 disorder ([place]) 04/21/2018  ? Allergic rhinitis 04/21/2018  ? Encounter for long-term (current) use of high-risk medication 05/03/2017  ? Pulmonary nodule 03/08/2017  ? Lofgren's syndrome 09/08/2016  ? Abnormal CXR 08/22/2016  ? Pressure urticaria 06/23/2015  ? OSA on CPAP 01/27/2014  ? Pure hypercholesterolemia 12/30/2011  ? PCOS (polycystic ovarian syndrome) 12/30/2011  ? Depression 12/30/2011  ? ? ? ? ?The patient does tolerate PAP and reports benefit from PAP use. The patient was reminded how to adjust mask fit and advised to change supplies regularly. The patient was also counselled on nightly use. The compliance is poor however she used her old machine as well. The AHI is 0.6. pressure increased to 10cm h2O due to patient preference and will check remote 1 month download. ? ? ?1. OSA on CPAP ?Continue nightly use ? ?2. CPAP use counseling ?CPAP couseling-Discussed importance of adequate CPAP use as well as proper care and  cleaning techniques of machine and all supplies. ? ?3. Bipolar 2 disorder ([place]) ?Continue current medication and f/u with psych. ? ?4. Morbid obesity with BMI of 45.0-49.9, adult ([place]) ?Obesity Counseling: Had a le

## 2022-02-08 ENCOUNTER — Ambulatory Visit (INDEPENDENT_AMBULATORY_CARE_PROVIDER_SITE_OTHER): Payer: BC Managed Care – PPO | Admitting: Internal Medicine

## 2022-02-08 VITALS — BP 137/99 | HR 90 | Resp 16 | Ht 64.0 in | Wt 275.6 lb

## 2022-02-08 DIAGNOSIS — Z7189 Other specified counseling: Secondary | ICD-10-CM

## 2022-02-08 DIAGNOSIS — R030 Elevated blood-pressure reading, without diagnosis of hypertension: Secondary | ICD-10-CM

## 2022-02-08 DIAGNOSIS — F3181 Bipolar II disorder: Secondary | ICD-10-CM

## 2022-02-08 DIAGNOSIS — Z6841 Body Mass Index (BMI) 40.0 and over, adult: Secondary | ICD-10-CM

## 2022-02-08 DIAGNOSIS — G4733 Obstructive sleep apnea (adult) (pediatric): Secondary | ICD-10-CM

## 2022-02-08 DIAGNOSIS — Z9989 Dependence on other enabling machines and devices: Secondary | ICD-10-CM

## 2022-02-08 NOTE — Patient Instructions (Signed)

## 2022-03-10 ENCOUNTER — Ambulatory Visit
Admission: RE | Admit: 2022-03-10 | Discharge: 2022-03-10 | Disposition: A | Discharge status: Home / Self Care | Payer: BC Managed Care – PPO | Source: Ambulatory Visit | Attending: Family Medicine | Admitting: Family Medicine

## 2022-03-10 DIAGNOSIS — Z1231 Encounter for screening mammogram for malignant neoplasm of breast: Secondary | ICD-10-CM | POA: Insufficient documentation

## 2022-12-28 IMAGING — MG MM DIGITAL SCREENING BILAT W/ TOMO AND CAD
8 series · 8 of 24 positions shown · non-contrast
Comparison: Previous exam(s).

CLINICAL DATA: Screening.

EXAM:
DIGITAL SCREENING BILATERAL MAMMOGRAM WITH TOMOSYNTHESIS AND CAD
TECHNIQUE: Bilateral screening digital craniocaudal and mediolateral oblique
mammograms were obtained. Bilateral screening digital breast
tomosynthesis was performed. The images were evaluated with
computer-aided detection.

[L CC synth-2D]
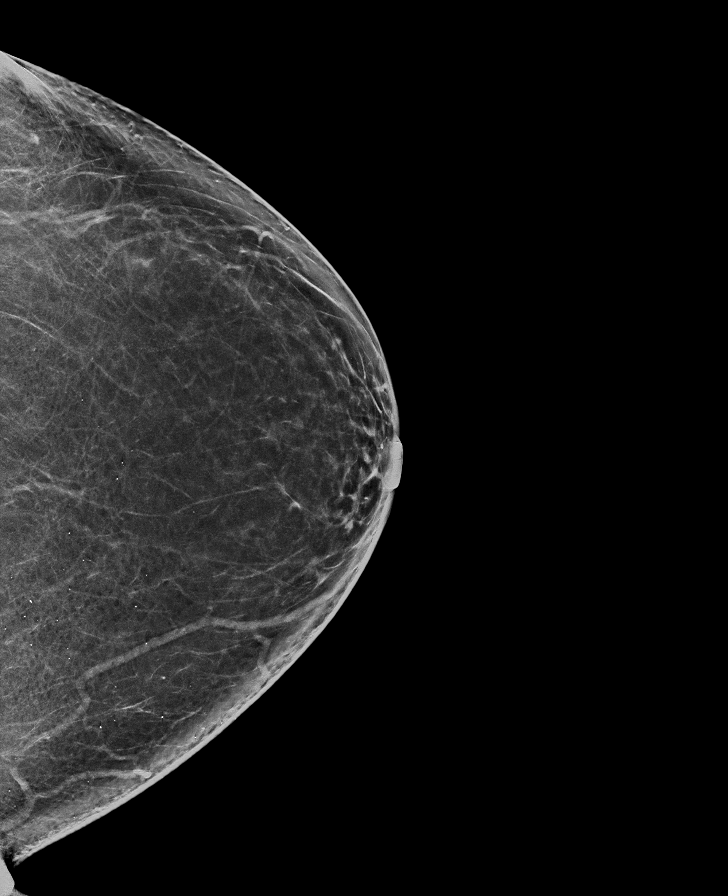

[L MLO synth-2D]
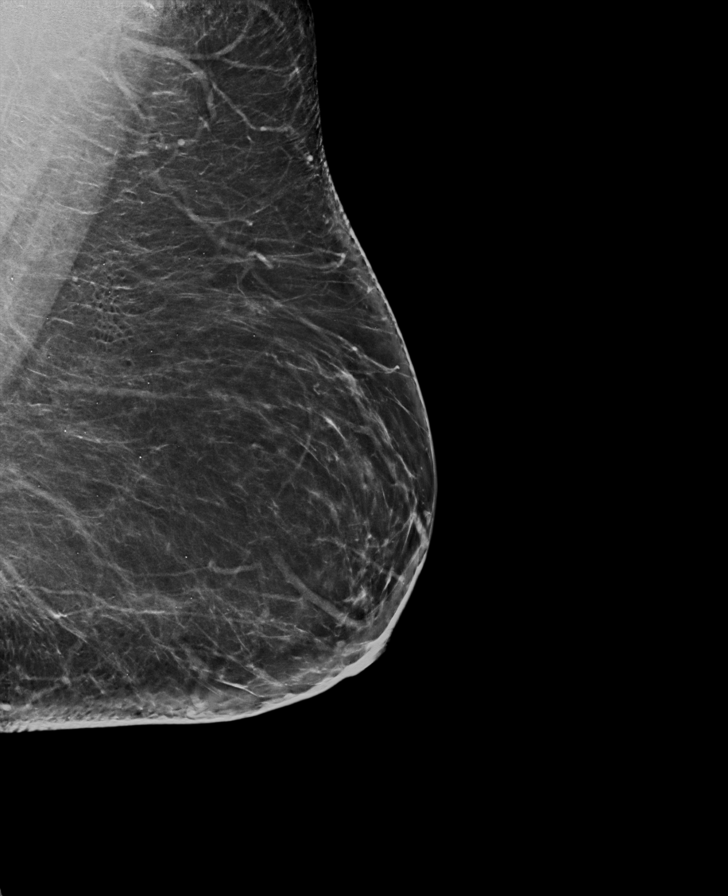

[R CC synth-2D]
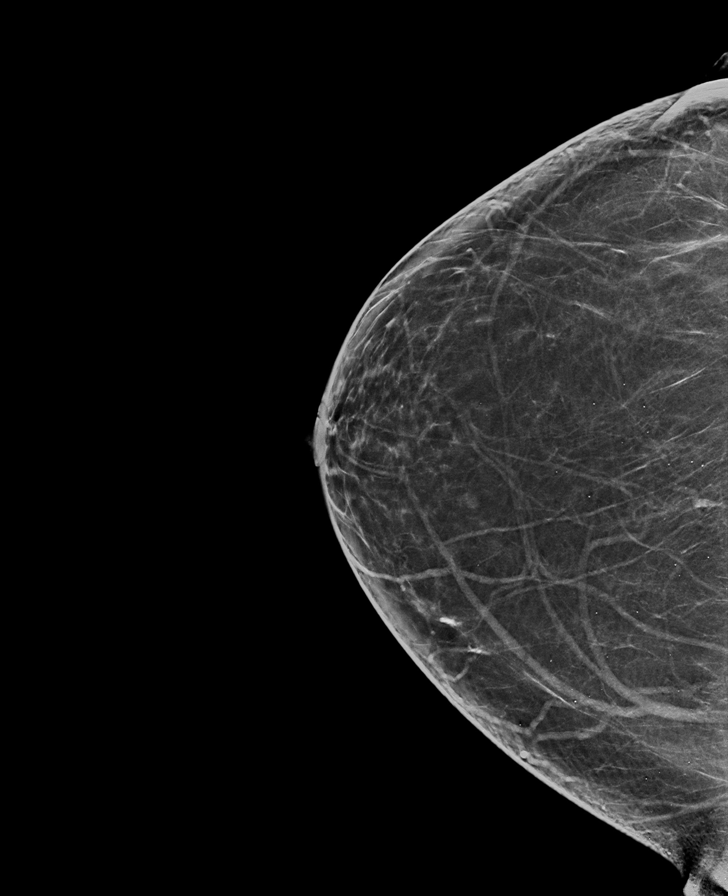

[R MLO synth-2D]
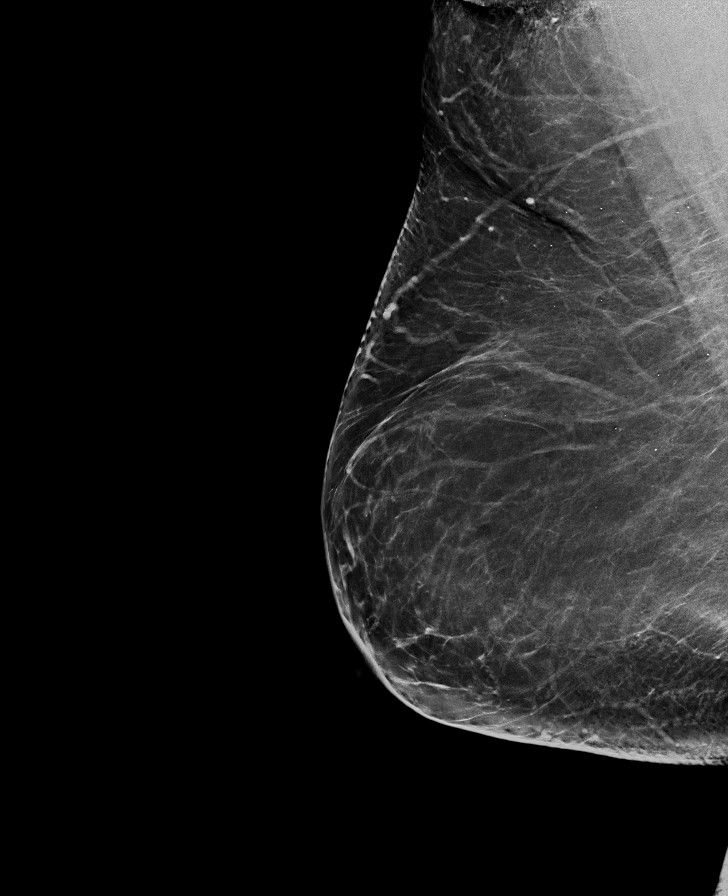

[R CC tomo · tomo slice 41/80.0]
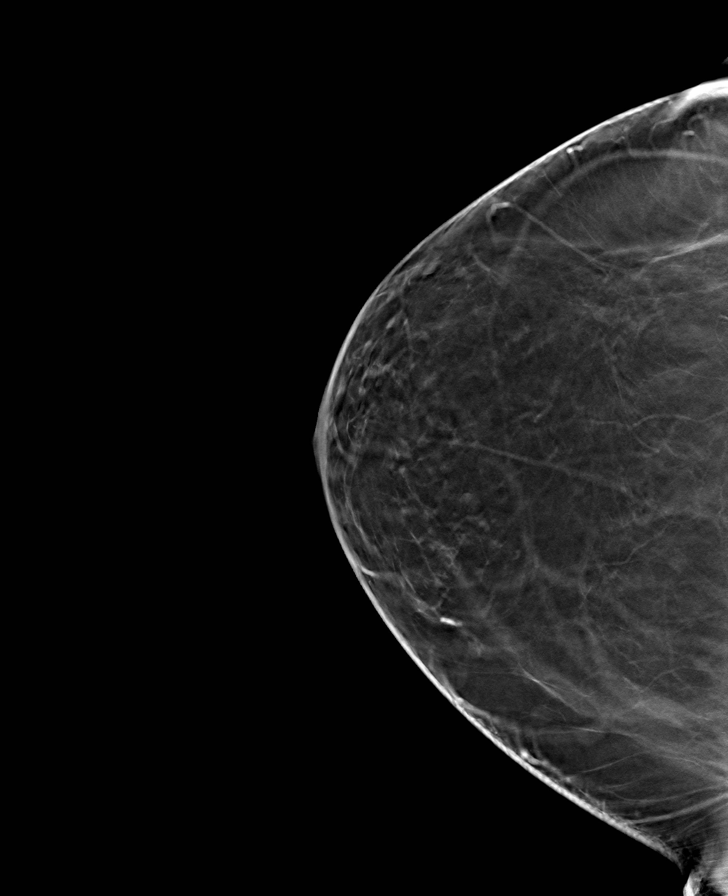

[L CC tomo · tomo slice 41/82.0]
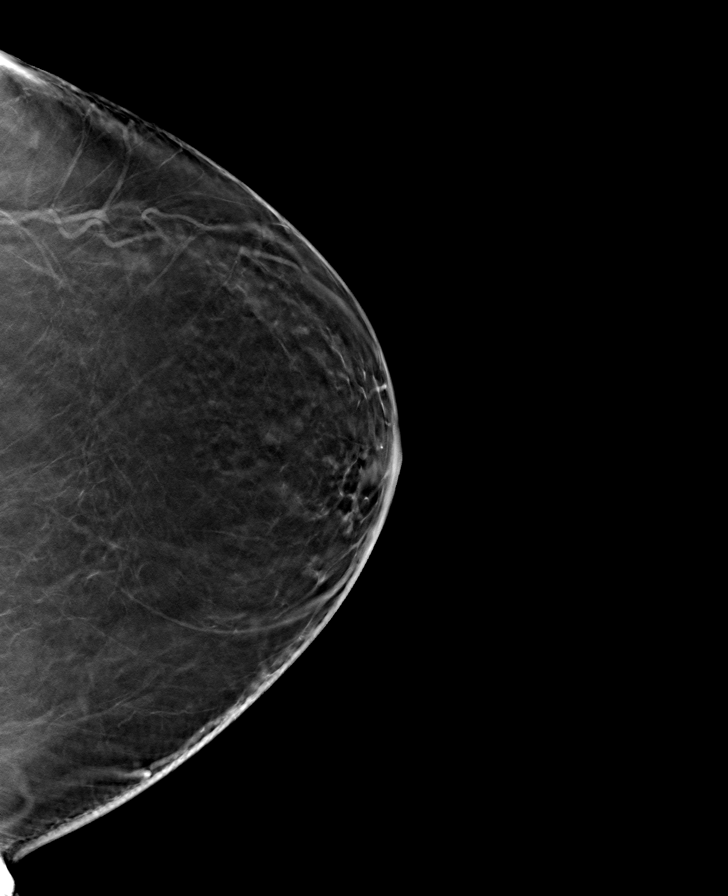

[R MLO tomo · tomo slice 45/90.0]
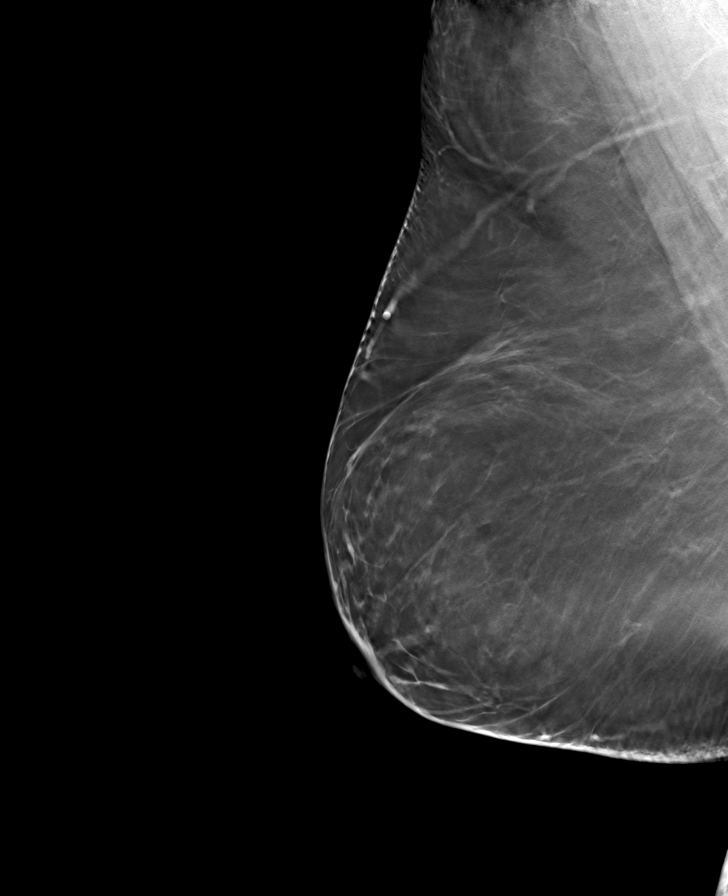

[L MLO tomo · tomo slice 45/89.0]
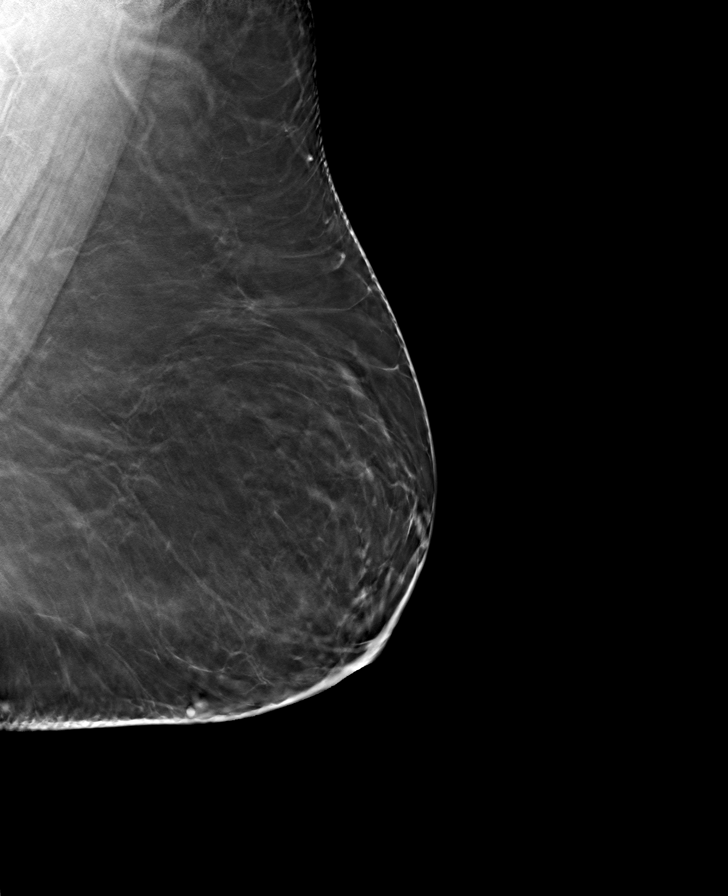

[8 of 24 positions shown; findings below may reference images not displayed]

ACR Breast Density Category b: There are scattered areas of
fibroglandular density.
FINDINGS: There are no findings suspicious for malignancy.
IMPRESSION: No mammographic evidence of malignancy. A result letter of this
screening mammogram will be mailed directly to the patient.

RECOMMENDATION:
Screening mammogram in one year. (Code:51-O-LD2)

BI-RADS CATEGORY  1: Negative.

## 2023-02-06 NOTE — Progress Notes (Unsigned)
[facility] [place], [place] [address]  Pulmonary Sleep Medicine   Office Visit Note  Patient Name: Christine Smith DOB: [birth year] MRN [id number]    Chief Complaint: Obstructive Sleep Apnea visit  Brief History:  Christine Smith is seen today for an annual follow up visit for CPAP@ 10 cmH2O. The patient has a 16 year history of sleep apnea. Patient is using PAP nightly.  The patient feels rested after sleeping with PAP.  The patient reports benefiting from PAP use. Reported sleepiness is improved and the Epworth Sleepiness Score is 7 out of 24. The patient does not take naps. The patient complains of the following: none reported.  The compliance download shows 99% compliance with an average use time of 9 hours 2 minutes. The AHI is 0.8.  The patient does not complain of limb movements disrupting sleep. The patient continues to require PAP therapy in order to eliminate sleep apnea.   ROS  General: (-) fever, (-) chills, (-) night sweat Nose and Sinuses: (-) nasal stuffiness or itchiness, (-) postnasal drip, (-) nosebleeds, (-) sinus trouble. Mouth and Throat: (-) sore throat, (-) hoarseness. Neck: (-) swollen glands, (-) enlarged thyroid, (-) neck pain. Respiratory: - cough, - shortness of breath, - wheezing. Neurologic: + numbness, + tingling. Psychiatric: - anxiety, - depression   Current Medication: Outpatient Encounter Medications as of 02/07/2023  Medication Sig Note   triamcinolone cream (KENALOG) 0.1 % APPLY TO EFFECTED AREA TWICE DAILY FOR 1-2 WEEKS    Ascorbic Acid (VITAMIN C ER PO) Take by mouth.    Cholecalciferol (VITAMIN D3) 2000 units capsule Take by mouth.    Coenzyme Q10 (CO Q 10) 10 MG CAPS Take by mouth.    fluticasone (FLONASE) 50 MCG/ACT nasal spray SPRAY 2 SPRAYS INTO EACH NOSTRIL EVERY DAY    ipratropium (ATROVENT) 0.06 % nasal spray Place 2 sprays into both nostrils 4 (four) times daily.    lamoTRIgine (LAMICTAL) 150 MG tablet Take  100 mg by mouth daily.    levocetirizine (XYZAL) 5 MG tablet Take by mouth.    metFORMIN (GLUCOPHAGE) 500 MG tablet Take 500 mg by mouth 2 (two) times daily with a meal. 11/11/2021: Takes for PCOS, not diabetes   Multiple Vitamins-Minerals (ZINC PO) Take by mouth.    promethazine-dextromethorphan (PROMETHAZINE-DM) 6.25-15 MG/5ML syrup Take 5 mLs by mouth 4 (four) times daily as needed.    propranolol (INDERAL) 20 MG tablet SMARTSIG:0.5-1 Tablet(s) By Mouth    simvastatin (ZOCOR) 40 MG tablet TAKE 1 TABLET BY MOUTH EVERY DAY AT NIGHT    vortioxetine HBr (TRINTELLIX) 10 MG TABS tablet Take 10 mg by mouth daily.    No facility-administered encounter medications on file as of 02/07/2023.    Surgical History: Past Surgical History:  Procedure Laterality Date   COLONOSCOPY WITH PROPOFOL N/A 12/03/2021   Procedure: COLONOSCOPY WITH PROPOFOL;  Surgeon: Lucilla Lame, MD;  Location: [facility];  Service: Endoscopy;  Laterality: N/A;   WISDOM TOOTH EXTRACTION      Medical History: Past Medical History:  Diagnosis Date   Bipolar disorder    type 2   Depression    Hypercholesteremia    Lofgren's syndrome 07/2016   no reoccuring sx in the last few yrs as of 11/11/21   PCOS (polycystic ovarian syndrome)    Sleep apnea    uses CPAP    Family History: Non contributory to the present illness  Social History: Social History   Socioeconomic History   Marital  status: Married    Spouse name: Not on file   Number of children: Not on file   Years of education: Not on file   Highest education level: Not on file  Occupational History   Not on file  Tobacco Use   Smoking status: Former    Years: 12    Types: Cigarettes    Quit date: 2017    Years since quitting: 7.2   Smokeless tobacco: Never  Vaping Use   Vaping Use: Never used  Substance and Sexual Activity   Alcohol use: Yes    Alcohol/week: 23.0 standard drinks of alcohol    Types: 1 Glasses of wine, 21 Cans of beer, 1 Shots of  liquor per week   Drug use: Never   Sexual activity: Not on file  Other Topics Concern   Not on file  Social History Narrative   Not on file   Social Determinants of Health   Financial Resource Strain: Not on file  Food Insecurity: Not on file  Transportation Needs: Not on file  Physical Activity: Not on file  Stress: Not on file  Social Connections: Not on file  Intimate Partner Violence: Not on file    Vital Signs: Blood pressure 134/85, pulse 64, resp. rate 18, height 5\' 4"  (1.626 m), weight 299 lb (135.6 kg), SpO2 97 %. Body mass index is 51.32 kg/m.    Examination: General Appearance: The patient is well-developed, well-nourished, and in no distress. Neck Circumference: 41.5 cm Skin: Gross inspection of skin unremarkable. Head: normocephalic, no gross deformities. Eyes: no gross deformities noted. ENT: ears appear grossly normal Neurologic: Alert and oriented. No involuntary movements.  STOP BANG RISK ASSESSMENT S (snore) Have you been told that you snore?     NO   T (tired) Are you often tired, fatigued, or sleepy during the day?   NO  O (obstruction) Do you stop breathing, choke, or gasp during sleep? NO   P (pressure) Do you have or are you being treated for high blood pressure? NO   B (BMI) Is your body index greater than 35 kg/m? YES   A (age) Are you 12 years old or older? YES   N (neck) Do you have a neck circumference greater than 16 inches?   YES   G (gender) Are you a female? NO   TOTAL STOP/BANG "YES" ANSWERS 3       A STOP-Bang score of 2 or less is considered low risk, and a score of 5 or more is high risk for having either moderate or severe OSA. For people who score 3 or 4, doctors may need to perform further assessment to determine how likely they are to have OSA.         EPWORTH SLEEPINESS SCALE:  Scale:  (0)= no chance of dozing; (1)= slight chance of dozing; (2)= moderate chance of dozing; (3)= high chance of dozing  Chance   Situtation    Sitting and reading: 1    Watching TV: 1    Sitting Inactive in public: 1    As a passenger in car: 1      Lying down to rest: 2    Sitting and talking: 0    Sitting quielty after lunch: 1    In a car, stopped in traffic: 0   TOTAL SCORE:   7 out of 24    SLEEP STUDIES:  PSG (11/2021) AHI 8/hr, REM AHI 19/hr, min SpO2 85% Titration (11/2021) CPAP@ 6 cmH2O,  PLM 10.1   CPAP COMPLIANCE DATA:  Date Range: 02/06/2022-02/05/2023  Average Daily Use: 9 hours 2 minutes  Median Use: 8 hours 49 minutes  Compliance for > 4 Hours: 99%  AHI: 0.8 respiratory events per hour  Days Used: 361/365 days  Mask Leak: 9.6  95th Percentile Pressure: 10         LABS: No results found for this or any previous visit (from the past 2160 hour(s)).  Radiology: MM 3D SCREEN BREAST BILATERAL  Result Date: 03/10/2022 CLINICAL DATA:  Screening. EXAM: DIGITAL SCREENING BILATERAL MAMMOGRAM WITH TOMOSYNTHESIS AND CAD TECHNIQUE: Bilateral screening digital craniocaudal and mediolateral oblique mammograms were obtained. Bilateral screening digital breast tomosynthesis was performed. The images were evaluated with computer-aided detection. COMPARISON:  Previous exam(s). ACR Breast Density Category b: There are scattered areas of fibroglandular density. FINDINGS: There are no findings suspicious for malignancy. IMPRESSION: No mammographic evidence of malignancy. A result letter of this screening mammogram will be mailed directly to the patient. RECOMMENDATION: Screening mammogram in one year. (Code:SM-B-01Y) BI-RADS CATEGORY  1: Negative. Electronically Signed   By: Valentino Saxon M.D.   On: 03/10/2022 16:33    No results found.  No results found.    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 The patient was reminded how to adjust mask fit and advised to change supplies regularly. The patient was also counselled on nightly use. The compliance is excellent. The AHI is 0.8. Pt continues to require cpap to treat her apnea and is medically necessary.   1. OSA on CPAP Continue excellent compliance  2. CPAP use counseling CPAP couseling-Discussed importance of adequate CPAP use as well as proper care and cleaning techniques of machine and all supplies.  3. Bipolar 2 disorder Continue current medication and f/u with psych.  4. Morbid obesity with BMI of 50.0-59.9, adult Obesity Counseling: Had a lengthy discussion regarding patients BMI and weight issues. Patient was instructed on portion control as well as increased activity. Also discussed caloric restrictions with trying to maintain intake less than 2000 Kcal. Discussions were made in accordance with the 5As of weight management. Simple actions such as not eating late and if able to, taking a walk is suggested.    General Counseling: I have discussed the findings of the evaluation and examination with Raquel Sarna.  I have also discussed any further diagnostic evaluation thatmay be needed or ordered today. Leteisha verbalizes understanding of the findings of todays visit. We also reviewed her medications today and discussed drug interactions and side effects including but not limited excessive drowsiness and altered mental states. We also discussed that there is always a risk not just to her but also people around her. she has been encouraged to call the office with any questions or concerns that  should arise related to todays visit.  No orders of the defined types were placed in this encounter.       I have personally obtained a history, examined the patient,  evaluated laboratory and imaging results, formulated the assessment and plan and placed orders.  This patient was seen by Drema Dallas, PA-C in collaboration with Dr. Devona Konig as a part of collaborative care agreement.  Allyne Gee, MD [facility] Diplomate ABMS Pulmonary Critical Care Medicine and Sleep Medicine

## 2023-02-07 ENCOUNTER — Ambulatory Visit (INDEPENDENT_AMBULATORY_CARE_PROVIDER_SITE_OTHER): Payer: No Typology Code available for payment source | Admitting: Internal Medicine

## 2023-02-07 VITALS — BP 134/85 | HR 64 | Resp 18 | Ht 64.0 in | Wt 299.0 lb

## 2023-02-07 DIAGNOSIS — Z6841 Body Mass Index (BMI) 40.0 and over, adult: Secondary | ICD-10-CM

## 2023-02-07 DIAGNOSIS — F3181 Bipolar II disorder: Secondary | ICD-10-CM

## 2023-02-07 DIAGNOSIS — Z7189 Other specified counseling: Secondary | ICD-10-CM

## 2023-02-07 DIAGNOSIS — G4733 Obstructive sleep apnea (adult) (pediatric): Secondary | ICD-10-CM

## 2023-02-07 NOTE — Patient Instructions (Signed)

## 2023-02-17 ENCOUNTER — Other Ambulatory Visit: Payer: Self-pay | Admitting: Family Medicine

## 2023-02-17 DIAGNOSIS — Z1231 Encounter for screening mammogram for malignant neoplasm of breast: Secondary | ICD-10-CM

## 2023-03-23 ENCOUNTER — Ambulatory Visit
Admission: RE | Admit: 2023-03-23 | Discharge: 2023-03-23 | Disposition: A | Discharge status: Home / Self Care | Payer: No Typology Code available for payment source | Source: Ambulatory Visit | Attending: Family Medicine | Admitting: Family Medicine

## 2023-03-23 DIAGNOSIS — Z1231 Encounter for screening mammogram for malignant neoplasm of breast: Secondary | ICD-10-CM | POA: Diagnosis not present

## 2023-10-26 ENCOUNTER — Ambulatory Visit
Admission: EM | Admit: 2023-10-26 | Discharge: 2023-10-26 | Disposition: A | Discharge status: Home / Self Care | Payer: No Typology Code available for payment source

## 2023-10-26 DIAGNOSIS — R052 Subacute cough: Secondary | ICD-10-CM

## 2023-10-26 DIAGNOSIS — H6993 Unspecified Eustachian tube disorder, bilateral: Secondary | ICD-10-CM

## 2023-10-26 MED ORDER — PROMETHAZINE-DM 6.25-15 MG/5ML PO SYRP: 5.0000 mL | ORAL_SOLUTION | Freq: Four times a day (QID) | ORAL | 0 refills | Status: DC | PRN

## 2023-10-26 MED ORDER — BENZONATATE 100 MG PO CAPS: 200.0000 mg | ORAL_CAPSULE | Freq: Three times a day (TID) | ORAL | 0 refills | Status: DC

## 2023-10-26 MED ORDER — IPRATROPIUM BROMIDE 0.06 % NA SOLN: 2.0000 | Freq: Four times a day (QID) | NASAL | 12 refills | Status: DC

## 2023-10-26 NOTE — ED Provider Notes (Signed)
[facility]    CSN: [id number] Arrival date & time: 10/26/23  1054      History   Chief Complaint Chief Complaint  Patient presents with   Cough   Otalgia   Sore Throat    HPI [facility] Christine Smith is a 52 y.o. female.   HPI  52 year old female with past medical history significant for bipolar type II, depression, hypercholesterolemia, Lofgren syndrome, PCOS, and sleep apnea on CPAP presents for evaluation of lingering cough that is been present since Thanksgiving.  She was prescribed a round of Augmentin on 10/13/2023 and reports that the cough has improved and is now nonproductive.  Over the last 2 days she has developed a sore throat and also pressure in her left ear.  She denies any fever, shortness breath, or wheezing.  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      Home Medications    Prior to Admission medications   Medication Sig Start Date End  Date Taking? Authorizing Provider  benzonatate (TESSALON) 100 MG capsule Take 2 capsules (200 mg total) by mouth every 8 (eight) hours. 10/26/23  Yes Becky Augusta, NP  lamoTRIgine (LAMICTAL) 150 MG tablet Take 100 mg by mouth daily.   Yes [provider]  metFORMIN (GLUCOPHAGE) 500 MG tablet Take 500 mg by mouth 2 (two) times daily with a meal. 03/10/18  Yes [provider]  simvastatin (ZOCOR) 40 MG tablet TAKE 1 TABLET BY MOUTH EVERY DAY AT NIGHT 03/23/18  Yes [provider]  tirzepatide (ZEPBOUND) 7.5 MG/0.5ML Pen Inject 1 syringe (7.5mg  dose)  subcutaneously once a week. 09/21/23  Yes [provider]  Ascorbic Acid (VITAMIN C ER PO) Take by mouth.    [provider]  Cholecalciferol (VITAMIN D3) 2000 units capsule Take by mouth.    [provider]  Coenzyme Q10 (CO Q 10) 10 MG CAPS Take by mouth.    [provider]  fluticasone (FLONASE) 50 MCG/ACT nasal spray SPRAY 2 SPRAYS INTO EACH NOSTRIL EVERY DAY 02/20/18  Yes [provider]  ipratropium (ATROVENT) 0.06 % nasal spray Place 2 sprays into both nostrils 4 (four) times daily. 10/26/23   Becky Augusta, NP  levocetirizine Elita Boone) 5 MG tablet Take by mouth. 09/19/16   [provider]  Multiple Vitamins-Minerals (ZINC PO) Take by mouth.    [provider]  promethazine-dextromethorphan (PROMETHAZINE-DM)  6.25-15 MG/5ML syrup Take 5 mLs by mouth 4 (four) times daily as needed. 10/26/23   Becky Augusta, NP  propranolol (INDERAL) 20 MG tablet SMARTSIG:0.5-1 Tablet(s) By Mouth   Yes [provider]  triamcinolone cream (KENALOG) 0.1 % APPLY TO EFFECTED AREA TWICE DAILY FOR 1-2 WEEKS 10/11/21  Yes [provider]  vortioxetine HBr (TRINTELLIX) 10 MG TABS tablet Take 10 mg by mouth daily.   Yes [provider]    Family History Family History  Problem Relation Age of Onset   Breast cancer Neg Hx     Social History Social History    Tobacco Use   Smoking status: Former    Current packs/day: 0.00    Types: Cigarettes    Start date: 2005    Quit date: 2017    Years since quitting: 7.9   Smokeless tobacco: Never  Vaping Use   Vaping status: Never Used  Substance Use Topics   Alcohol use: Yes    Alcohol/week: 23.0 standard drinks of alcohol    Types: 1 Glasses of wine, 21 Cans of beer, 1 Shots of liquor per week   Drug use: Never     Allergies   Augmentin [amoxicillin-pot clavulanate]   Review of Systems Review of Systems  Constitutional:  Negative for fever.  HENT:  Positive for ear pain and sore throat. Negative for ear discharge.   Respiratory:  Positive for cough. Negative for shortness of breath and wheezing.      Physical Exam Triage Vital Signs ED Triage Vitals  Encounter Vitals Group     BP      Systolic BP Percentile      Diastolic BP Percentile      Pulse      Resp      Temp      Temp src      SpO2      Weight      Height      Head Circumference      Peak Flow      Pain Score      Pain Loc      Pain Education      Exclude from Growth Chart    No data found.  Updated Vital Signs BP (!) 151/103 (BP Location: Right Arm)   Pulse 72   Temp 98 F (36.7 C) (Oral)   Resp 19   LMP 10/24/2023 (Exact Date)   SpO2 99%   Visual Acuity Right Eye Distance:   Left Eye Distance:   Bilateral Distance:    Right Eye Near:   Left Eye Near:    Bilateral Near:     Physical Exam Vitals and nursing note reviewed.  Constitutional:      Appearance: Normal appearance. She is not ill-appearing.  HENT:     Head: Normocephalic and atraumatic.     Right Ear: Tympanic membrane, ear canal and external ear normal. There is no impacted cerumen.     Left Ear: Tympanic membrane, ear canal and external ear normal. There is no impacted cerumen.     Ears:     Comments: Tenderness with external palpation of bilateral eustachian tubes.    Nose: Congestion and rhinorrhea present.     Comments:  Nasal mucosa is erythematous and edematous with clear discharge in both nares.    Mouth/Throat:     Mouth: Mucous membranes are moist.     Pharynx: Oropharynx is clear. No oropharyngeal exudate or posterior oropharyngeal erythema.  Cardiovascular:  Rate and Rhythm: Normal rate and regular rhythm.     Pulses: Normal pulses.     Heart sounds: Normal heart sounds. No murmur heard.    No friction rub. No gallop.  Pulmonary:     Effort: Pulmonary effort is normal.     Breath sounds: Normal breath sounds. No wheezing, rhonchi or rales.  Musculoskeletal:     Cervical back: Normal range of motion and neck supple. No tenderness.  Lymphadenopathy:     Cervical: No cervical adenopathy.  Skin:    General: Skin is warm and dry.     Capillary Refill: Capillary refill takes less than 2 seconds.     Findings: No rash.  Neurological:     General: No focal deficit present.     Mental Status: She is alert and oriented to person, place, and time.      UC Treatments / Results  Labs (all labs ordered are listed, but only abnormal results are displayed) Labs Reviewed - No data to display  EKG   Radiology No results found.  Procedures Procedures (including critical care time)  Medications Ordered in UC Medications - No data to display  Initial Impression / Assessment and Plan / UC Course  I have reviewed the triage vital signs and the nursing notes.  Pertinent labs & imaging results that were available during my care of the patient were reviewed by me and considered in my medical decision making (see chart for details).   Patient is a nontoxic-appearing 52 year old female presenting for evaluation of lingering respiratory symptoms as outlined HPI above.  Her symptoms began after Thanksgiving and after approximately 1 week of symptoms she woke up with her eyes matted shut so she did a telehealth visit where she was diagnosed with conjunctivitis and also an upper respiratory infection and  took a 10-day course of Augmentin and also use Cipro drops.  Her eyes have improved but she is continuing to have of a lingering nonproductive cough.  No shortness of breath or wheezing and no fever.  In the last 2 days she developed a soreness to her throat mainly on the right-hand side and also pressure in her left ear.  Her physical exam reveals pearly gray tympanic membranes bilaterally with normal light reflex and clear external auditory canals.  She does have tenderness with external palpation of bilateral eustachian tubes, worse on the right than the left.  She reports that she has been using the Valsalva maneuver and it will clear both eustachian tubes but then they become blocked and full again.  Her nasal mucosa is edematous and she has clear rhinorrhea.  Cardiopulmonary exam is benign.  I suspect the patient has some residual eustachian tube dysfunction after her upper respiratory infection.  I will have her continue her Flonase at home and will add Atrovent to her therapy course.  I will also prescribe Tessalon Perles and Promethazine DM cough syrup for her lingering cough which is most likely residual inflammation from her respiratory infection   Final Clinical Impressions(s) / UC Diagnoses   Final diagnoses:  Eustachian tube dysfunction, bilateral  Subacute cough     Discharge Instructions      Use the Atrovent nasal spray, 2 squirts in each nostril every 6 hours, to help with nasal congestion.  Take over-the-counter Zyrtec, Claritin, or Allegra once daily to help with allergic symptoms.  Instill 2 squirts of fluticasone in each nostril at bedtime nightly.  And the nasal away from the septum of your nose  and follow each set of squirts with 1 squirt of nasal saline to push the particles up into your turbinates where they will take effect.  Continue to equalize your ears as shown to help clear mucus from eustachian tubes and maintain patency.   For your cough I believe it is  residual inflammation from your recent respiratory infection that is leading to your continued cough.  Also some postnasal drip.  The Atrovent nasal spray should help with that.  Use the Tessalon Perles every 8 hours as needed for cough.  Take them with a small sip of water.  You may experience numbness to the base of your tongue or metallic taste in her mouth, this is normal.  Use the Promethazine DM cough syrup at bedtime as needed for cough and congestion.  Return for reevaluation, or see your primary care provider, for any continued or worsening symptoms.     ED Prescriptions     Medication Sig Dispense Auth. Provider   ipratropium (ATROVENT) 0.06 % nasal spray Place 2 sprays into both nostrils 4 (four) times daily. 15 mL Becky Augusta, NP   promethazine-dextromethorphan (PROMETHAZINE-DM) 6.25-15 MG/5ML syrup Take 5 mLs by mouth 4 (four) times daily as needed. 118 mL Becky Augusta, NP   benzonatate (TESSALON) 100 MG capsule Take 2 capsules (200 mg total) by mouth every 8 (eight) hours. 21 capsule Becky Augusta, NP      PDMP not reviewed this encounter.   Becky Augusta, NP 10/26/23 1239

## 2023-10-26 NOTE — Discharge Instructions (Addendum)
Use the Atrovent nasal spray, 2 squirts in each nostril every 6 hours, to help with nasal congestion.  Take over-the-counter Zyrtec, Claritin, or Allegra once daily to help with allergic symptoms.  Instill 2 squirts of fluticasone in each nostril at bedtime nightly.  And the nasal away from the septum of your nose and follow each set of squirts with 1 squirt of nasal saline to push the particles up into your turbinates where they will take effect.  Continue to equalize your ears as shown to help clear mucus from eustachian tubes and maintain patency.   For your cough I believe it is residual inflammation from your recent respiratory infection that is leading to your continued cough.  Also some postnasal drip.  The Atrovent nasal spray should help with that.  Use the Tessalon Perles every 8 hours as needed for cough.  Take them with a small sip of water.  You may experience numbness to the base of your tongue or metallic taste in her mouth, this is normal.  Use the Promethazine DM cough syrup at bedtime as needed for cough and congestion.  Return for reevaluation, or see your primary care provider, for any continued or worsening symptoms.

## 2023-10-26 NOTE — ED Triage Notes (Addendum)
Cough since Thanksgiving.  Patient had conjunctivitis on 12/6 and took Augmentin.   Cough has gotten some what better. She now has a sore throat x 2 days and left ear pain/pressure.   Took at home covid test that was negative

## 2024-02-19 ENCOUNTER — Other Ambulatory Visit: Payer: Self-pay | Admitting: Family Medicine

## 2024-02-19 DIAGNOSIS — Z1231 Encounter for screening mammogram for malignant neoplasm of breast: Secondary | ICD-10-CM

## 2024-03-25 ENCOUNTER — Ambulatory Visit
Admission: RE | Admit: 2024-03-25 | Discharge: 2024-03-25 | Disposition: A | Discharge status: Home / Self Care | Source: Ambulatory Visit | Attending: Family Medicine | Admitting: Family Medicine

## 2024-03-25 DIAGNOSIS — Z1231 Encounter for screening mammogram for malignant neoplasm of breast: Secondary | ICD-10-CM | POA: Insufficient documentation

## 2024-05-07 NOTE — Progress Notes (Signed)
 [facility] [address] [place], [place] [address]  Pulmonary Sleep Medicine   Office Visit Note  Patient Name: Christine Smith DOB: [birth year] MRN [id number]    Chief Complaint: Obstructive Sleep Apnea visit  Brief History:  Christine Smith is seen today for an annual follow up visit for CPAP@ 10 cmH2O. The patient has a 17 year history of sleep apnea. Patient is using PAP nightly.  The patient feels rested after sleeping with PAP.  The patient reports benefiting from PAP use. Reported sleepiness is  improved and the Epworth Sleepiness Score is 6 out of 24. The patient does not take naps. The patient complains of the following: pt is in need of a new water chamber.  The compliance download shows 98% compliance with an average use time of 9 hours 29 minutes. The AHI is 0.8.  The patient does have some limb movements disrupting sleep. The patient continues to require PAP therapy in order to eliminate sleep apnea.   ROS  General: (-) fever, (-) chills, (-) night sweat Nose and Sinuses: (-) nasal stuffiness or itchiness, (-) postnasal drip, (-) nosebleeds, (-) sinus trouble. Mouth and Throat: (-) sore throat, (-) hoarseness. Neck: (-) swollen glands, (-) enlarged thyroid, (-) neck pain. Respiratory: - cough, - shortness of breath, - wheezing. Neurologic: + numbness, + tingling. Psychiatric: + anxiety, - depression   Current Medication: Outpatient Encounter Medications as of 05/08/2024  Medication Sig Note   gabapentin (NEURONTIN) 300 MG capsule 1 po bid x 3 days, then 1 po tid    metFORMIN (GLUCOPHAGE) 1000 MG tablet Take 1,000 mg by mouth.    tirzepatide (ZEPBOUND) 15 MG/0.5ML Pen Inject 15 mg into the skin.    vortioxetine HBr (TRINTELLIX) 20 MG TABS tablet Take 20 mg by mouth.    Cholecalciferol (VITAMIN D3) 2000 units capsule Take by mouth.    Coenzyme Q10 (CO Q 10) 10 MG CAPS Take by mouth.    fluticasone (FLONASE) 50 MCG/ACT nasal spray SPRAY 2 SPRAYS INTO EACH  NOSTRIL EVERY DAY    lamoTRIgine (LAMICTAL) 100 MG tablet Take 100 mg by mouth daily.    simvastatin (ZOCOR) 40 MG tablet TAKE 1 TABLET BY MOUTH EVERY DAY AT NIGHT    [DISCONTINUED] Ascorbic Acid (VITAMIN C ER PO) Take by mouth.    [DISCONTINUED] benzonatate  (TESSALON ) 100 MG capsule Take 2 capsules (200 mg total) by mouth every 8 (eight) hours.    [DISCONTINUED] ipratropium (ATROVENT ) 0.06 % nasal spray Place 2 sprays into both nostrils 4 (four) times daily.    [DISCONTINUED] lamoTRIgine (LAMICTAL) 150 MG tablet Take 100 mg by mouth daily.    [DISCONTINUED] levocetirizine (XYZAL) 5 MG tablet Take by mouth.    [DISCONTINUED] metFORMIN (GLUCOPHAGE) 500 MG tablet Take 500 mg by mouth 2 (two) times daily with a meal. 11/11/2021: Takes for PCOS, not diabetes   [DISCONTINUED] Multiple Vitamins-Minerals (ZINC PO) Take by mouth.    [DISCONTINUED] promethazine -dextromethorphan (PROMETHAZINE -DM) 6.25-15 MG/5ML syrup Take 5 mLs by mouth 4 (four) times daily as needed.    [DISCONTINUED] propranolol (INDERAL) 20 MG tablet SMARTSIG:0.5-1 Tablet(s) By Mouth    [DISCONTINUED] tirzepatide (ZEPBOUND) 7.5 MG/0.5ML Pen Inject 1 syringe (7.5mg  dose)  subcutaneously once a week.    [DISCONTINUED] triamcinolone cream (KENALOG) 0.1 % APPLY TO EFFECTED AREA TWICE DAILY FOR 1-2 WEEKS    [DISCONTINUED] vortioxetine HBr (TRINTELLIX) 10 MG TABS tablet Take 10 mg by mouth daily.    No facility-administered encounter medications on file as of 05/08/2024.  Surgical History: Past Surgical History:  Procedure Laterality Date   COLONOSCOPY WITH PROPOFOL  N/A 12/03/2021   Procedure: COLONOSCOPY WITH PROPOFOL ;  Surgeon: Christine Carmine, MD;  Location: [facility];  Service: Endoscopy;  Laterality: N/A;   WISDOM TOOTH EXTRACTION      Medical History: Past Medical History:  Diagnosis Date   Bipolar disorder (HCC)    type 2   Depression    Hypercholesteremia    Lofgren's syndrome 07/2016   no reoccuring sx in the last  few yrs as of 11/11/21   PCOS (polycystic ovarian syndrome)    Sleep apnea    uses CPAP    Family History: Non contributory to the present illness  Social History: Social History   Socioeconomic History   Marital status: Married    Spouse name: Not on file   Number of children: Not on file   Years of education: Not on file   Highest education level: Not on file  Occupational History   Not on file  Tobacco Use   Smoking status: Former    Current packs/day: 0.00    Types: Cigarettes    Start date: 2005    Quit date: 2017    Years since quitting: 8.5   Smokeless tobacco: Never  Vaping Use   Vaping status: Never Used  Substance and Sexual Activity   Alcohol use: Yes    Alcohol/week: 23.0 standard drinks of alcohol    Types: 1 Glasses of wine, 21 Cans of beer, 1 Shots of liquor per week   Drug use: Never   Sexual activity: Not on file  Other Topics Concern   Not on file  Social History Narrative   Not on file   Social Drivers of Health   Financial Resource Strain: Low Risk  (11/05/2023)   Received from [facility]   Overall Financial Resource Strain (CARDIA)    Difficulty of Paying Living Expenses: Not hard at all  Food Insecurity: No Food Insecurity (11/05/2023)   Received from [facility]   Hunger Vital Sign    Within the past 12 months, you worried that your food would run out before you got the money to buy more.: Never true    Within the past 12 months, the food you bought just didn't last and you didn't have money to get more.: Never true  Transportation Needs: No Transportation Needs (11/05/2023)   Received from [facility] - Transportation    In the past 12 months, has lack of transportation kept you from medical appointments or from getting medications?: No    Lack of Transportation (Non-Medical): No  Physical Activity: Not on file  Stress: Not on file  Social Connections: Not on file   Intimate Partner Violence: Not on file    Vital Signs: Blood pressure (!) 135/95, pulse 86, resp. rate 16, height 5' 4 (1.626 m), weight 266 lb (120.7 kg), SpO2 98%. Body mass index is 45.66 kg/m.    Examination: General Appearance: The patient is well-developed, well-nourished, and in no distress. Neck Circumference: 41.5 cm Skin: Gross inspection of skin unremarkable. Head: normocephalic, no gross deformities. Eyes: no gross deformities noted. ENT: ears appear grossly normal Neurologic: Alert and oriented. No involuntary movements.  STOP BANG RISK ASSESSMENT S (snore) Have you been told that you snore?     NO   T (tired) Are you often tired, fatigued, or sleepy during the day?   NO  O (obstruction) Do  you stop breathing, choke, or gasp during sleep? NO   P (pressure) Do you have or are you being treated for high blood pressure? NO   B (BMI) Is your body index greater than 35 kg/m? YES   A (age) Are you 66 years old or older? YES   N (neck) Do you have a neck circumference greater than 16 inches?   YES   G (gender) Are you a female? NO   TOTAL STOP/BANG "YES" ANSWERS 3       A STOP-Bang score of 2 or less is considered low risk, and a score of 5 or more is high risk for having either moderate or severe OSA. For people who score 3 or 4, doctors may need to perform further assessment to determine how likely they are to have OSA.         EPWORTH SLEEPINESS SCALE:  Scale:  (0)= no chance of dozing; (1)= slight chance of dozing; (2)= moderate chance of dozing; (3)= high chance of dozing  Chance  Situtation    Sitting and reading: 1    Watching TV: 1    Sitting Inactive in public: 1    As a passenger in car: 1      Lying down to rest: 1    Sitting and talking: 0    Sitting quielty after lunch: 1    In a car, stopped in traffic: 0   TOTAL SCORE:   6 out of 24    SLEEP STUDIES:  PSG - 1.2023 - AHI 8/hr, REM AHI 19/hr, min Sp02 85% [place] - 11/2021 -  CPAP @ 6cmH20   CPAP COMPLIANCE DATA:  Date Range: 05/07/2023-05/05/2024  Average Daily Use: 9 hours 29 minutes  Median Use: 9 hours 6 minutes  Compliance for > 4 Hours: 98%  AHI: 0.8 respiratory events per hour  Days Used: 357/365 days  Mask Leak: 5.8  95th Percentile Pressure: 10         LABS: No results found for this or any previous visit (from the past 2160 hours).  Radiology: MM 3D SCREENING MAMMOGRAM BILATERAL BREAST Result Date: 03/28/2024 CLINICAL DATA:  Screening. EXAM: DIGITAL SCREENING BILATERAL MAMMOGRAM WITH TOMOSYNTHESIS AND CAD TECHNIQUE: Bilateral screening digital craniocaudal and mediolateral oblique mammograms were obtained. Bilateral screening digital breast tomosynthesis was performed. The images were evaluated with computer-aided detection. COMPARISON:  Previous exam(s). ACR Breast Density Category a: The breasts are almost entirely fatty. FINDINGS: There are no findings suspicious for malignancy. IMPRESSION: No mammographic evidence of malignancy. A result letter of this screening mammogram will be mailed directly to the patient. RECOMMENDATION: Screening mammogram in one year. (Code:[id number]-B-01Y) BI-RADS CATEGORY  1: Negative. Electronically Signed   By: Debby Satterfield M.D.   On: 03/28/2024 15:31    No results found.  No results found.    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 The patient was reminded how to adjust  mask fit and advised to change supplies  regularly. The patient was also counselled on nightly use. The compliance is excellent. The AHI is 0.8. Patient continues to require PAP to treat their apnea and is medically necessary.   1. OSA on CPAP (Primary) Continue excellent compliance  2. CPAP use counseling CPAP couseling-Discussed importance of adequate CPAP use as well as proper care and cleaning techniques of machine and all supplies.  3. Bipolar 2 disorder (HCC) Followed by psych  4. Morbid obesity with BMI of 45.0-49.9, adult (HCC) Obesity Counseling: Had a lengthy discussion regarding patients BMI and weight issues. Patient was instructed on portion control as well as increased activity. Also discussed caloric restrictions with trying to maintain intake less than 2000 Kcal. Discussions were made in accordance with the 5As of weight management. Simple actions such as not eating late and if able to, taking a walk is suggested.    General Counseling: I have discussed the findings of the evaluation and examination with Damien.  I have also discussed any further diagnostic evaluation thatmay be needed or ordered today. Hadlyn verbalizes understanding of the findings of todays visit. We also reviewed her medications today and discussed drug interactions and side effects including but not limited excessive drowsiness and altered mental states. We also discussed that there is always a risk not just to her but also people around her. she has been encouraged to call the office with any questions or concerns that should arise related to todays visit.  No orders of the defined types were placed in this encounter.       I have personally obtained a history, examined the patient, evaluated laboratory and imaging results, formulated the assessment and plan and placed orders.  This patient was seen by Tinnie Pro, PA-C in collaboration with Dr. Elfreda Bathe as a part of collaborative care  agreement.  Elfreda DELENA Bathe, MD [facility] Diplomate ABMS Pulmonary Critical Care Medicine and Sleep Medicine

## 2024-05-08 ENCOUNTER — Ambulatory Visit (INDEPENDENT_AMBULATORY_CARE_PROVIDER_SITE_OTHER): Payer: Self-pay | Admitting: Internal Medicine

## 2024-05-08 VITALS — BP 135/95 | HR 86 | Resp 16 | Ht 64.0 in | Wt 266.0 lb

## 2024-05-08 DIAGNOSIS — Z7189 Other specified counseling: Secondary | ICD-10-CM | POA: Diagnosis not present

## 2024-05-08 DIAGNOSIS — G4733 Obstructive sleep apnea (adult) (pediatric): Secondary | ICD-10-CM

## 2024-05-08 DIAGNOSIS — F3181 Bipolar II disorder: Secondary | ICD-10-CM | POA: Diagnosis not present

## 2024-05-08 DIAGNOSIS — Z6841 Body Mass Index (BMI) 40.0 and over, adult: Secondary | ICD-10-CM

## 2024-05-08 NOTE — Patient Instructions (Signed)
